# Patient Record
Sex: Male | Born: 1950 | Race: White | Hispanic: No | Marital: Married | Smoking: Former smoker
Health system: Southern US, Community
[De-identification: ages and names within clinical notes are randomized; demographics above are authoritative.]

## PROBLEM LIST (undated history)

## (undated) DIAGNOSIS — Z86718 Personal history of other venous thrombosis and embolism: Secondary | ICD-10-CM

## (undated) DIAGNOSIS — L089 Local infection of the skin and subcutaneous tissue, unspecified: Secondary | ICD-10-CM

## (undated) DIAGNOSIS — I4891 Unspecified atrial fibrillation: Secondary | ICD-10-CM

## (undated) DIAGNOSIS — E785 Hyperlipidemia, unspecified: Secondary | ICD-10-CM

## (undated) DIAGNOSIS — Z8673 Personal history of transient ischemic attack (TIA), and cerebral infarction without residual deficits: Secondary | ICD-10-CM

## (undated) DIAGNOSIS — E11628 Type 2 diabetes mellitus with other skin complications: Secondary | ICD-10-CM

## (undated) DIAGNOSIS — M869 Osteomyelitis, unspecified: Secondary | ICD-10-CM

## (undated) DIAGNOSIS — C679 Malignant neoplasm of bladder, unspecified: Secondary | ICD-10-CM

## (undated) DIAGNOSIS — I5022 Chronic systolic (congestive) heart failure: Secondary | ICD-10-CM

## (undated) HISTORY — PX: OTHER SURGICAL HISTORY: SHX169

---

## 2014-10-29 DIAGNOSIS — G8190 Hemiplegia, unspecified affecting unspecified side: Secondary | ICD-10-CM

## 2014-10-29 HISTORY — DX: Hemiplegia, unspecified affecting unspecified side: G81.90

## 2020-12-28 DIAGNOSIS — U071 COVID-19: Secondary | ICD-10-CM

## 2020-12-28 HISTORY — DX: COVID-19: U07.1

## 2021-01-02 ENCOUNTER — Emergency Department: Payer: Medicare HMO

## 2021-01-02 ENCOUNTER — Inpatient Hospital Stay
Admission: EM | Admit: 2021-01-02 | Discharge: 2021-01-07 | DRG: 872 | Disposition: A | Discharge status: Home / Self Care | Payer: Medicare HMO | Attending: Internal Medicine | Admitting: Internal Medicine

## 2021-01-02 ENCOUNTER — Other Ambulatory Visit: Payer: Self-pay

## 2021-01-02 ENCOUNTER — Encounter: Payer: Self-pay | Admitting: Emergency Medicine

## 2021-01-02 DIAGNOSIS — N134 Hydroureter: Secondary | ICD-10-CM | POA: Diagnosis present

## 2021-01-02 DIAGNOSIS — U071 COVID-19: Secondary | ICD-10-CM | POA: Diagnosis present

## 2021-01-02 DIAGNOSIS — I4891 Unspecified atrial fibrillation: Secondary | ICD-10-CM | POA: Diagnosis present

## 2021-01-02 DIAGNOSIS — E440 Moderate protein-calorie malnutrition: Secondary | ICD-10-CM | POA: Diagnosis present

## 2021-01-02 DIAGNOSIS — C679 Malignant neoplasm of bladder, unspecified: Secondary | ICD-10-CM | POA: Diagnosis present

## 2021-01-02 DIAGNOSIS — A419 Sepsis, unspecified organism: Secondary | ICD-10-CM | POA: Diagnosis present

## 2021-01-02 DIAGNOSIS — E11621 Type 2 diabetes mellitus with foot ulcer: Secondary | ICD-10-CM | POA: Diagnosis present

## 2021-01-02 DIAGNOSIS — Z599 Problem related to housing and economic circumstances, unspecified: Secondary | ICD-10-CM

## 2021-01-02 DIAGNOSIS — L89622 Pressure ulcer of left heel, stage 2: Secondary | ICD-10-CM | POA: Diagnosis present

## 2021-01-02 DIAGNOSIS — E11628 Type 2 diabetes mellitus with other skin complications: Secondary | ICD-10-CM | POA: Diagnosis present

## 2021-01-02 DIAGNOSIS — R197 Diarrhea, unspecified: Secondary | ICD-10-CM | POA: Diagnosis not present

## 2021-01-02 DIAGNOSIS — N390 Urinary tract infection, site not specified: Secondary | ICD-10-CM | POA: Diagnosis present

## 2021-01-02 DIAGNOSIS — Z794 Long term (current) use of insulin: Secondary | ICD-10-CM

## 2021-01-02 DIAGNOSIS — E785 Hyperlipidemia, unspecified: Secondary | ICD-10-CM | POA: Diagnosis present

## 2021-01-02 DIAGNOSIS — Z7901 Long term (current) use of anticoagulants: Secondary | ICD-10-CM

## 2021-01-02 DIAGNOSIS — L089 Local infection of the skin and subcutaneous tissue, unspecified: Secondary | ICD-10-CM | POA: Diagnosis present

## 2021-01-02 DIAGNOSIS — E1122 Type 2 diabetes mellitus with diabetic chronic kidney disease: Secondary | ICD-10-CM | POA: Diagnosis present

## 2021-01-02 DIAGNOSIS — E1151 Type 2 diabetes mellitus with diabetic peripheral angiopathy without gangrene: Secondary | ICD-10-CM | POA: Diagnosis present

## 2021-01-02 DIAGNOSIS — A0472 Enterocolitis due to Clostridium difficile, not specified as recurrent: Secondary | ICD-10-CM | POA: Diagnosis present

## 2021-01-02 DIAGNOSIS — Z87891 Personal history of nicotine dependence: Secondary | ICD-10-CM

## 2021-01-02 DIAGNOSIS — Z79899 Other long term (current) drug therapy: Secondary | ICD-10-CM

## 2021-01-02 DIAGNOSIS — I5022 Chronic systolic (congestive) heart failure: Secondary | ICD-10-CM | POA: Diagnosis present

## 2021-01-02 DIAGNOSIS — Z8616 Personal history of COVID-19: Secondary | ICD-10-CM

## 2021-01-02 DIAGNOSIS — R6520 Severe sepsis without septic shock: Secondary | ICD-10-CM | POA: Diagnosis present

## 2021-01-02 DIAGNOSIS — I130 Hypertensive heart and chronic kidney disease with heart failure and stage 1 through stage 4 chronic kidney disease, or unspecified chronic kidney disease: Secondary | ICD-10-CM | POA: Diagnosis present

## 2021-01-02 DIAGNOSIS — Z8673 Personal history of transient ischemic attack (TIA), and cerebral infarction without residual deficits: Secondary | ICD-10-CM | POA: Diagnosis not present

## 2021-01-02 DIAGNOSIS — A414 Sepsis due to anaerobes: Principal | ICD-10-CM | POA: Diagnosis present

## 2021-01-02 DIAGNOSIS — I4811 Longstanding persistent atrial fibrillation: Secondary | ICD-10-CM | POA: Diagnosis not present

## 2021-01-02 DIAGNOSIS — Z9862 Peripheral vascular angioplasty status: Secondary | ICD-10-CM

## 2021-01-02 DIAGNOSIS — N179 Acute kidney failure, unspecified: Secondary | ICD-10-CM | POA: Diagnosis present

## 2021-01-02 DIAGNOSIS — I4819 Other persistent atrial fibrillation: Secondary | ICD-10-CM | POA: Diagnosis present

## 2021-01-02 DIAGNOSIS — N184 Chronic kidney disease, stage 4 (severe): Secondary | ICD-10-CM | POA: Diagnosis present

## 2021-01-02 DIAGNOSIS — Z888 Allergy status to other drugs, medicaments and biological substances status: Secondary | ICD-10-CM

## 2021-01-02 DIAGNOSIS — Z86718 Personal history of other venous thrombosis and embolism: Secondary | ICD-10-CM

## 2021-01-02 DIAGNOSIS — E875 Hyperkalemia: Secondary | ICD-10-CM | POA: Diagnosis present

## 2021-01-02 DIAGNOSIS — L97529 Non-pressure chronic ulcer of other part of left foot with unspecified severity: Secondary | ICD-10-CM | POA: Diagnosis present

## 2021-01-02 HISTORY — DX: Type 2 diabetes mellitus with other skin complications: E11.628

## 2021-01-02 HISTORY — DX: Osteomyelitis, unspecified: M86.9

## 2021-01-02 HISTORY — DX: Personal history of transient ischemic attack (TIA), and cerebral infarction without residual deficits: Z86.73

## 2021-01-02 HISTORY — DX: Malignant neoplasm of bladder, unspecified: C67.9

## 2021-01-02 HISTORY — DX: Hyperlipidemia, unspecified: E78.5

## 2021-01-02 HISTORY — DX: Chronic systolic (congestive) heart failure: I50.22

## 2021-01-02 HISTORY — DX: Unspecified atrial fibrillation: I48.91

## 2021-01-02 HISTORY — DX: Personal history of other venous thrombosis and embolism: Z86.718

## 2021-01-02 HISTORY — DX: Local infection of the skin and subcutaneous tissue, unspecified: L08.9

## 2021-01-02 LAB — GASTROINTESTINAL PANEL BY PCR, STOOL (REPLACES STOOL CULTURE)

## 2021-01-02 LAB — CBC WITH DIFFERENTIAL/PLATELET
Abs Immature Granulocytes: 0.08 10*3/uL — ABNORMAL HIGH (ref 0.00–0.07)
Basophils Absolute: 0.1 10*3/uL (ref 0.0–0.1)
Basophils Relative: 0 %
Eosinophils Absolute: 0 10*3/uL (ref 0.0–0.5)
Eosinophils Relative: 0 %
HCT: 36.5 % — ABNORMAL LOW (ref 39.0–52.0)
Hemoglobin: 12.1 g/dL — ABNORMAL LOW (ref 13.0–17.0)
Immature Granulocytes: 1 %
Lymphocytes Relative: 2 %
Lymphs Abs: 0.4 10*3/uL — ABNORMAL LOW (ref 0.7–4.0)
MCH: 25.9 pg — ABNORMAL LOW (ref 26.0–34.0)
MCHC: 33.2 g/dL (ref 30.0–36.0)
MCV: 78.2 fL — ABNORMAL LOW (ref 80.0–100.0)
Monocytes Absolute: 0.5 10*3/uL (ref 0.1–1.0)
Monocytes Relative: 3 %
Neutro Abs: 15.4 10*3/uL — ABNORMAL HIGH (ref 1.7–7.7)
Neutrophils Relative %: 94 %
Platelets: 147 10*3/uL — ABNORMAL LOW (ref 150–400)
RBC: 4.67 MIL/uL (ref 4.22–5.81)
RDW: 15.4 % (ref 11.5–15.5)
WBC: 16.4 10*3/uL — ABNORMAL HIGH (ref 4.0–10.5)
nRBC: 0 % (ref 0.0–0.2)

## 2021-01-02 LAB — COMPREHENSIVE METABOLIC PANEL
ALT: 36 U/L (ref 0–44)
AST: 24 U/L (ref 15–41)
Albumin: 3.4 g/dL — ABNORMAL LOW (ref 3.5–5.0)
Alkaline Phosphatase: 78 U/L (ref 38–126)
Anion gap: 12 (ref 5–15)
BUN: 87 mg/dL — ABNORMAL HIGH (ref 8–23)
CO2: 22 mmol/L (ref 22–32)
Calcium: 9 mg/dL (ref 8.9–10.3)
Chloride: 100 mmol/L (ref 98–111)
Creatinine, Ser: 3.17 mg/dL — ABNORMAL HIGH (ref 0.61–1.24)
GFR, Estimated: 20 mL/min — ABNORMAL LOW (ref >60)
Glucose, Bld: 207 mg/dL — ABNORMAL HIGH (ref 70–99)
Potassium: 5.7 mmol/L — ABNORMAL HIGH (ref 3.5–5.1)
Sodium: 134 mmol/L — ABNORMAL LOW (ref 135–145)
Total Bilirubin: 0.8 mg/dL (ref 0.3–1.2)
Total Protein: 7.6 g/dL (ref 6.5–8.1)

## 2021-01-02 LAB — PROTIME-INR
INR: 1.3 — ABNORMAL HIGH (ref 0.8–1.2)
Prothrombin Time: 15.7 seconds — ABNORMAL HIGH (ref 11.4–15.2)

## 2021-01-02 LAB — URINALYSIS, COMPLETE (UACMP) WITH MICROSCOPIC
Bilirubin Urine: NEGATIVE
Glucose, UA: NEGATIVE mg/dL
Ketones, ur: NEGATIVE mg/dL
Leukocytes,Ua: NEGATIVE
Nitrite: NEGATIVE
Protein, ur: 100 mg/dL — AB
RBC / HPF: >50 RBC/hpf — ABNORMAL HIGH (ref 0–5)
Specific Gravity, Urine: 1.013 (ref 1.005–1.030)
pH: 5 (ref 5.0–8.0)

## 2021-01-02 LAB — C DIFFICILE QUICK SCREEN W PCR REFLEX
C Diff antigen: POSITIVE — AB
C Diff interpretation: DETECTED
C Diff toxin: POSITIVE — AB

## 2021-01-02 LAB — MAGNESIUM: Magnesium: 1.7 mg/dL (ref 1.7–2.4)

## 2021-01-02 LAB — CBG MONITORING, ED
Glucose-Capillary: 177 mg/dL — ABNORMAL HIGH (ref 70–99)
Glucose-Capillary: 250 mg/dL — ABNORMAL HIGH (ref 70–99)
Glucose-Capillary: 150 mg/dL — ABNORMAL HIGH (ref 70–99)

## 2021-01-02 LAB — LACTIC ACID, PLASMA: Lactic Acid, Venous: 1.7 mmol/L (ref 0.5–1.9)

## 2021-01-02 LAB — GLUCOSE, CAPILLARY: Glucose-Capillary: 140 mg/dL — ABNORMAL HIGH (ref 70–99)

## 2021-01-02 LAB — BRAIN NATRIURETIC PEPTIDE: B Natriuretic Peptide: 283.6 pg/mL — ABNORMAL HIGH (ref 0.0–100.0)

## 2021-01-02 MED ORDER — MAGNESIUM SULFATE IN D5W 1-5 GM/100ML-% IV SOLN
1.0000 g | Freq: Once | INTRAVENOUS | Status: AC
  Administered 2021-01-02: 1 g via INTRAVENOUS
  Filled 2021-01-02: qty 100

## 2021-01-02 MED ORDER — INSULIN ASPART 100 UNIT/ML IJ SOLN
0.0000 [IU] | Freq: Every day | INTRAMUSCULAR | Status: DC
  Administered 2021-01-04: 3 [IU] via SUBCUTANEOUS
  Administered 2021-01-06: 4 [IU] via SUBCUTANEOUS
  Filled 2021-01-02 (×2): qty 1

## 2021-01-02 MED ORDER — ALBUTEROL SULFATE (2.5 MG/3ML) 0.083% IN NEBU: 3.0000 mL | INHALATION_SOLUTION | RESPIRATORY_TRACT | Status: DC | PRN

## 2021-01-02 MED ORDER — LACTATED RINGERS IV BOLUS
1000.0000 mL | Freq: Once | INTRAVENOUS | Status: AC
  Administered 2021-01-02: 1000 mL via INTRAVENOUS

## 2021-01-02 MED ORDER — FAMOTIDINE IN NACL 20-0.9 MG/50ML-% IV SOLN
20.0000 mg | Freq: Every day | INTRAVENOUS | Status: DC
  Administered 2021-01-02 – 2021-01-05 (×4): 20 mg via INTRAVENOUS
  Filled 2021-01-02 (×5): qty 50

## 2021-01-02 MED ORDER — ACETAMINOPHEN 325 MG PO TABS: 650.0000 mg | ORAL_TABLET | Freq: Four times a day (QID) | ORAL | Status: DC | PRN

## 2021-01-02 MED ORDER — ONDANSETRON HCL 4 MG/2ML IJ SOLN: 4.0000 mg | Freq: Three times a day (TID) | INTRAMUSCULAR | Status: DC | PRN

## 2021-01-02 MED ORDER — INSULIN ASPART 100 UNIT/ML IV SOLN
5.0000 [IU] | Freq: Once | INTRAVENOUS | Status: DC
  Filled 2021-01-02: qty 0.05

## 2021-01-02 MED ORDER — APIXABAN 5 MG PO TABS
5.0000 mg | ORAL_TABLET | Freq: Two times a day (BID) | ORAL | Status: DC
  Administered 2021-01-02 – 2021-01-07 (×11): 5 mg via ORAL
  Filled 2021-01-02 (×11): qty 1

## 2021-01-02 MED ORDER — DEXTROSE 50 % IV SOLN
50.0000 mL | INTRAVENOUS | Status: DC | PRN
  Administered 2021-01-02: 50 mL via INTRAVENOUS
  Filled 2021-01-02: qty 50

## 2021-01-02 MED ORDER — FIDAXOMICIN 200 MG PO TABS
200.0000 mg | ORAL_TABLET | Freq: Two times a day (BID) | ORAL | Status: DC
  Administered 2021-01-02 – 2021-01-07 (×11): 200 mg via ORAL
  Filled 2021-01-02 (×15): qty 1

## 2021-01-02 MED ORDER — DM-GUAIFENESIN ER 30-600 MG PO TB12: 1.0000 | ORAL_TABLET | Freq: Two times a day (BID) | ORAL | Status: DC | PRN

## 2021-01-02 MED ORDER — INSULIN ASPART 100 UNIT/ML IV SOLN
3.0000 [IU] | Freq: Once | INTRAVENOUS | Status: AC
  Administered 2021-01-02: 3 [IU] via INTRAVENOUS
  Filled 2021-01-02: qty 0.03

## 2021-01-02 MED ORDER — INSULIN ASPART 100 UNIT/ML IJ SOLN
0.0000 [IU] | Freq: Three times a day (TID) | INTRAMUSCULAR | Status: DC
  Administered 2021-01-02: 2 [IU] via SUBCUTANEOUS
  Administered 2021-01-02: 3 [IU] via SUBCUTANEOUS
  Administered 2021-01-03 – 2021-01-04 (×3): 2 [IU] via SUBCUTANEOUS
  Administered 2021-01-05: 3 [IU] via SUBCUTANEOUS
  Administered 2021-01-05 – 2021-01-06 (×3): 2 [IU] via SUBCUTANEOUS
  Administered 2021-01-06: 5 [IU] via SUBCUTANEOUS
  Administered 2021-01-06 – 2021-01-07 (×2): 3 [IU] via SUBCUTANEOUS
  Filled 2021-01-02 (×11): qty 1

## 2021-01-02 MED ORDER — ONDANSETRON HCL 4 MG/2ML IJ SOLN
INTRAMUSCULAR | Status: AC
  Administered 2021-01-02: 4 mg via INTRAVENOUS
  Filled 2021-01-02: qty 2

## 2021-01-02 MED ORDER — ONDANSETRON HCL 4 MG/2ML IJ SOLN: 4.0000 mg | INTRAMUSCULAR | Status: AC

## 2021-01-02 MED ORDER — SODIUM ZIRCONIUM CYCLOSILICATE 10 G PO PACK
10.0000 g | PACK | ORAL | Status: AC
  Administered 2021-01-02: 10 g via ORAL
  Filled 2021-01-02: qty 1

## 2021-01-02 MED ORDER — METOPROLOL SUCCINATE ER 100 MG PO TB24
200.0000 mg | ORAL_TABLET | Freq: Every day | ORAL | Status: DC
  Administered 2021-01-02 – 2021-01-07 (×6): 200 mg via ORAL
  Filled 2021-01-02 (×5): qty 2
  Filled 2021-01-02: qty 4
  Filled 2021-01-02: qty 2

## 2021-01-02 MED ORDER — LACTATED RINGERS IV SOLN: INTRAVENOUS | Status: AC

## 2021-01-02 MED ORDER — INSULIN GLARGINE-YFGN 100 UNIT/ML ~~LOC~~ SOLN
5.0000 [IU] | Freq: Every day | SUBCUTANEOUS | Status: DC
  Administered 2021-01-02 – 2021-01-04 (×3): 5 [IU] via SUBCUTANEOUS
  Filled 2021-01-02 (×4): qty 0.05

## 2021-01-02 NOTE — ED Notes (Signed)
When asked about being diabetic. Pt advised he was pulled off his metformin awhile back and currently takes no medication for diabetes.

## 2021-01-02 NOTE — ED Provider Notes (Signed)
[facility] Emergency Department Provider Note  ____________________________________________   Event Date/Time   First MD Initiated Contact with Patient 01/02/21 [phone]     (approximate)  I have reviewed the triage vital signs and the nursing notes.   HISTORY  Chief Complaint No chief complaint on file.    HPI Christopher Davis is a 70 y.o. male who receives all of his medical care at [facility] and has recently been hospitalized there for acute kidney injury and hyperkalemia and uremia in the setting of COVID-19 diagnosis on 12/28/2020 (about 5 days ago).  He has been discharged from the hospital for less than 2 full days.  He presents tonight for generalized weakness.  He said that he has had diarrhea for weeks and has been on strong antibiotics (reportedly cefepime and vancomycin) for diabetic foot infection.  He is relatively recent status post amputation of toes on his left foot.  He is healing well from that but he had to be hospitalized within the [facility] system recently for his kidney problems and his potassium issues.  He was feeling a little bit better when he went home but then he gradually got worse and tonight felt much worse and was unable to ambulate due to weakness.  Paramedics report that he was "covered" in diarrhea.  The patient states that diarrhea has been an ongoing problem and that it is copious.  However he is not having abdominal pain.  He is having no nausea nor vomiting and he is trying to eat and drink.  He has been lightheaded.  He denies chest pain or shortness of breath in spite of his recent COVID-19 diagnosis.  Overall his symptoms are severe, exertion makes it worse, nothing in particular makes it better.     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 difficile colitis 01/02/2021     History reviewed. No pertinent surgical history.  Prior to Admission medications   Medication Sig Start Date End Date Taking? Authorizing Provider  acetaminophen (TYLENOL) 650 MG CR tablet Take 1,300 mg by mouth every 8 (eight) hours as needed.   Yes [provider]  apixaban (ELIQUIS) 5 MG TABS tablet Take 5 mg by mouth 2 (two) times daily. 10/21/20  Yes [provider]  insulin glargine (LANTUS) 100 UNIT/ML Solostar Pen Inject 10 Units into the skin at bedtime. 12/31/20  Yes [provider]  metoprolol succinate (TOPROL-XL) 100 MG 24 hr tablet Take 2 tablets by mouth daily. 09/15/20 09/15/21 Yes [provider]    Allergies Rosuvastatin  History reviewed. No pertinent family history.  Social History Social History   Tobacco Use   Smoking status: Former    Types: Cigarettes   Smokeless tobacco: Never    Review of Systems Constitutional: No fever/chills Eyes: No visual changes. ENT: No sore throat. Cardiovascular: Denies chest pain. Respiratory: Denies shortness of breath. Gastrointestinal: Positive for copious diarrhea for weeks.  Negative for abdominal pain and vomiting. Genitourinary: Negative for dysuria. Musculoskeletal: Improving left  foot after recent toe amputation. Integumentary: Negative for rash. Neurological: Positive for generalized weakness.  Negative for headaches, focal weakness or numbness.   ____________________________________________   PHYSICAL EXAM:  VITAL SIGNS: ED Triage Vitals [01/02/21 0400]  Enc Vitals Group     BP (!) 143/87     Pulse Rate (!) 120     Resp (!) 22     Temp 99.9 F (37.7 C)     Temp Source Oral     SpO2 96 %      Weight 69.9 kg (154 lb)     Height 1.753 m (5\' 9" )     Head Circumference      Peak Flow      Pain Score      Pain Loc      Pain Edu?      Excl. in [place]?     Constitutional: Alert and oriented.  Eyes: Conjunctivae are normal.  Head: Atraumatic. Nose: No congestion/rhinnorhea. Mouth/Throat: Patient is wearing a mask. Neck: No stridor.  No meningeal signs.   Cardiovascular: Tachycardia with irregularly irregular rhythm. Good peripheral circulation. Respiratory: Normal respiratory effort.  No retractions. Gastrointestinal: Soft and nontender. No distention.  Musculoskeletal: Status post multiple toe amputations on the left foot with a well-appearing wound, no evidence of acute infection. Neurologic:  Normal speech and language. No gross focal neurologic deficits are appreciated.  Skin:  Skin is warm, dry and intact. Psychiatric: Mood and affect are normal. Speech and behavior are normal.  ____________________________________________   LABS (all labs ordered are listed, but only abnormal results are displayed)  Labs Reviewed  C DIFFICILE QUICK SCREEN W PCR REFLEX   - Abnormal; Notable for the following components:      Result Value   C Diff antigen POSITIVE (*)    C Diff toxin POSITIVE (*)    All other components within normal limits  COMPREHENSIVE METABOLIC PANEL - Abnormal; Notable for the following components:   Sodium 134 (*)    Potassium 5.7 (*)    Glucose, Bld 207 (*)    BUN 87 (*)    Creatinine, Ser 3.17 (*)    Albumin 3.4 (*)    GFR, Estimated 20 (*)    All other components within normal limits  CBC WITH DIFFERENTIAL/PLATELET - Abnormal; Notable for the following components:   WBC 16.4 (*)    Hemoglobin 12.1 (*)    HCT 36.5 (*)    MCV 78.2 (*)    MCH 25.9 (*)    Platelets 147 (*)    Neutro Abs 15.4 (*)    Lymphs Abs 0.4 (*)    Abs Immature Granulocytes 0.08 (*)    All other components within normal limits  PROTIME-INR - Abnormal; Notable for the following  components:   Prothrombin Time 15.7 (*)    INR 1.3 (*)    All other components within normal limits  URINALYSIS, COMPLETE (UACMP) WITH MICROSCOPIC - Abnormal; Notable for the following components:   Color, Urine YELLOW (*)    APPearance CLOUDY (*)    Hgb urine dipstick LARGE (*)    Protein, ur 100 (*)    RBC / HPF >50 (*)    Bacteria, UA RARE (*)    All other components within normal limits  CULTURE, BLOOD (ROUTINE X 2)  CULTURE, BLOOD (ROUTINE X 2)  GASTROINTESTINAL PANEL BY PCR, STOOL (REPLACES STOOL CULTURE)  URINE CULTURE  LACTIC ACID, PLASMA  MAGNESIUM  BRAIN NATRIURETIC PEPTIDE   ____________________________________________  EKG  ED  ECG REPORT I, Hinda Kehr, the attending physician, personally viewed and interpreted this ECG.  Date: 01/02/2021 EKG Time: 4:49 AM Rate: 141 Rhythm: Atrial fibrillation with RVR QRS Axis: normal Intervals: normal ST/T Wave abnormalities: Non-specific ST segment / T-wave changes, but no clear evidence of acute ischemia. Narrative Interpretation: no definitive evidence of acute ischemia; does not meet STEMI criteria.  ____________________________________________  RADIOLOGY I, Hinda Kehr, personally viewed and evaluated these images (plain radiographs) as part of my medical decision making, as well as reviewing the written report by the radiologist.  ED MD interpretation: No acute abnormalities on chest x-ray.  Status post metatarsal amputation of fourth and fifth digits on the left foot.  Soft tissue ulceration, no evidence of persistent osteomyelitis.  Official radiology report(s): CT ABDOMEN PELVIS WO CONTRAST  Result Date: 01/02/2021 CLINICAL DATA:  Renal failure.  Diarrhea.  Obstructive uropathy. EXAM: CT ABDOMEN AND PELVIS WITHOUT CONTRAST TECHNIQUE: Multidetector CT imaging of the abdomen and pelvis was performed following the standard protocol without IV contrast. COMPARISON:  None. FINDINGS: Lower chest: No acute abnormality.  Hepatobiliary: No focal liver abnormality is seen. No gallstones, gallbladder wall thickening, or biliary dilatation. Pancreas: Unremarkable. No pancreatic ductal dilatation or surrounding inflammatory changes. Spleen: Normal in size without focal abnormality. Adrenals/Urinary Tract: Normal adrenal glands. No kidney mass or stones identified bilaterally. Right pelviectasis and mild right hydroureter to the level of the urinary bladder identified. Posterior dome of bladder wall lesion measures 1.8 cm, image 69/6. A second lesion is noted along the posterior base measuring 5.9 cm,, image 58/6. This second lesion appears to obstruct the right UVJ, image 76/2. Both of these are suspicious for urothelial neoplasms. Stomach/Bowel: Stomach appears normal. The appendix is not confidently identified. Within the limitations of unenhanced technique there is diffuse colonic wall thickening with mild pericolonic inflammatory soft tissue stranding concerning for colitis. No signs of pneumatosis or bowel perforation. No evidence for bowel obstruction. Moderate stool burden identified within the sigmoid colon and rectum. Vascular/Lymphatic: Aortic atherosclerosis. No aneurysm. No abdominopelvic adenopathy. Reproductive: Prostate is unremarkable. Other: No abdominal wall hernia or abnormality. No abdominopelvic ascites. Musculoskeletal: No acute or significant osseous findings. Marked degenerative disc disease identified within the lumbar spine. This is most advanced at L4-5 and L5-S1. IMPRESSION: 1. Examination is positive for diffuse colonic wall thickening with mild pericolonic inflammatory soft tissue stranding concerning for colitis. No signs of pneumatosis or bowel perforation. 2. Right-sided pelviectasis and mild right hydroureter to the level of the urinary bladder. 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 3. Lumbar spondylosis. 4. Aortic atherosclerosis. Aortic Atherosclerosis  (ICD10-I70.0). Electronically Signed   By: Kerby Moors M.D.   On: 01/02/2021 06:37   DG Chest Portable 1 View  Result Date: 01/02/2021 CLINICAL DATA:  70 year old male with diarrhea, fever, left foot wound. Diaphoresis. EXAM: PORTABLE CHEST 1 VIEW COMPARISON:  None. FINDINGS: Portable AP upright view at 0430 hours. Lung volumes and mediastinal contours are within normal limits. Visualized tracheal air column is within normal limits. Calcified aortic atherosclerosis. Allowing for portable technique the lungs are clear. Somewhat symmetric skin fold artifacts suspected over both upper lobes. No pneumothorax or pleural effusion. No acute osseous abnormality identified. IMPRESSION: 1.  No acute cardiopulmonary abnormality. 2.  Aortic Atherosclerosis (ICD10-I70.0). Electronically Signed   By: Genevie Ann M.D.   On: 01/02/2021 04:57   DG Foot Complete Left  Result Date: 01/02/2021 CLINICAL DATA:  Sepsis, chronic left foot wound EXAM: LEFT FOOT - COMPLETE  3+ VIEW COMPARISON:  None. FINDINGS: Transmetatarsal amputation of the a fourth and fifth digits has been performed. A a probable deep ulcer is seen within the soft tissues of the residual left forefoot laterally anterior to the a residual fourth metatarsal. There is no subjacent osseous erosion to suggest osteomyelitis. No acute fracture or dislocation. Residual joint spaces are preserved. Advanced vascular calcifications are seen within the left foot. Large plantar calcaneal spur. 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 Anterolateral soft tissue ulceration. No superimposed osseous erosion. Electronically Signed   By: Fidela Salisbury MD   On: 01/02/2021 04:59    ____________________________________________   PROCEDURES   Procedure(s) performed (including Critical Care):  .1-3 Lead EKG Interpretation  Date/Time: 01/02/2021 8:02 AM Performed by: Hinda Kehr, MD Authorized by: Hinda Kehr, MD     Interpretation:  abnormal     ECG rate:  115   ECG rate assessment: tachycardic     Rhythm: atrial fibrillation     Ectopy: none     Conduction: normal   .Critical Care  Date/Time: 01/02/2021 8:02 AM Performed by: Hinda Kehr, MD Authorized by: Hinda Kehr, MD   Critical care provider statement:    Critical care time (minutes):  60   Critical care time was exclusive of:  Separately billable procedures and treating other patients   Critical care was necessary to treat or prevent imminent or life-threatening deterioration of the following conditions:  Sepsis   Critical care was time spent personally by me on the following activities:  Development of treatment plan with patient or surrogate, discussions with consultants, evaluation of patient's response to treatment, examination of patient, obtaining history from patient or surrogate, ordering and performing treatments and interventions, ordering and review of laboratory studies, ordering and review of radiographic studies, pulse oximetry, re-evaluation of patient's condition and review of old charts   ____________________________________________   [place] / MDM / [place] / ED COURSE  As part of my medical decision making, I reviewed the following data within the [place] notes reviewed and incorporated, Labs reviewed , EKG interpreted , Old chart reviewed, Discussed with admitting physician , and Notes from prior ED visits   Differential diagnosis includes, but is not limited to, C. difficile colitis, sepsis, UTI, pneumonia, bacteremia, wound infection.  Patient's history of weeks of copious and worsening diarrhea in the setting of extended course of broad-spectrum antibiotics for osteomyelitis strongly suggest C. difficile.  Additionally, the character and quality of the diarrhea also strongly suggest C. difficile.  GI pathogen panel and C. difficile studies are pending.  Vital signs are notable for A.  fib with RVR and some tachypnea although I suspect this is due to his C. difficile sepsis.  He has having no vomiting nor abdominal pain and spite of his probable infection.  No indication for antiemetics nor pain medicine but I am ordering 2 L of lactated Ringer's were what appears to be substantial volume depletion.  The patient is on the cardiac monitor to evaluate for evidence of arrhythmia and/or significant heart rate changes.  His lab work came back fairly quickly and was notable for a creatinine of greater than 3.  I reviewed his medical record and see that when he was admitted within the last week to [facility], he had a creatinine of greater than 2 but not quite this high.  Apparently he has again worsened after going home.  Additionally he has hyperkalemia but with no significant EKG  changes.  I am ordering Lokelma 10 g but I suspect that fluid resuscitation will also help with the hyperkalemia as it will also help with kidney function.  His CBC is notable for a leukocytosis of 16.4 which he did NOT have when he was at [facility].  No indication to repeat his COVID test because he tested +5 days ago.  Urinalysis shows hematuria but he also has known ureteral or bladder masses suggestive of cancer and there is no indication of urinary tract infection.  His lactic acid is normal at 1.7.  He remains ill-appearing and tachycardic.     Clinical Course as of 01/02/21 0806  Sat Jan 02, 2021  0533 C Difficile Quick Screen w PCR reflex(!) Antigen and toxin are positive.  I will initiate treatment once the CT results are back proving that he does not have toxic megacolon.  Anticipate treatment with fidaxomicin [CF]  0648 CT ABDOMEN PELVIS WO CONTRAST Colitis, probable urinary neoplasm, no evidence of megacolon.  Putting an order for the DEXA [place] and consulting the hospitalist for admission. [CF]  [phone] Although the patient meets criteria for sepsis, I strongly believe that his issue is C. difficile infection in  the setting of acute on chronic kidney disease.  Giving him broad-spectrum empiric antibiotics would be possibly the worst thing for him.  I am ordering fortunately no 200 mg [CF]  0706 I updated the patient about his results and he understands the need to stay in the hospital. [CF]  0708 Patient now actively vomiting.  Ordered Zofran 4 mg IV. [CF]  0801 I discussed the case by phone with Dr. Blaine Hamper with the hospitalist service and he will admit. [CF]    Clinical Course User Index [CF] Hinda Kehr, MD     ____________________________________________  FINAL CLINICAL IMPRESSION(S) / ED DIAGNOSES  Final diagnoses:  C. difficile colitis  Sepsis with acute renal failure without septic shock, due to unspecified organism, unspecified acute renal failure type ([place])  Hyperkalemia  Acute renal failure, unspecified acute renal failure type ([place])     MEDICATIONS GIVEN DURING THIS VISIT:  Medications  fidaxomicin (DIFICID) tablet 200 mg (has no administration in time range)  lactated ringers infusion ( Intravenous New Bag/Given 01/02/21 0755)  ondansetron (ZOFRAN) injection 4 mg (has no administration in time range)  insulin aspart (novoLOG) injection 0-9 Units (has no administration in time range)  insulin aspart (novoLOG) injection 0-5 Units (has no administration in time range)  lactated ringers bolus 1,000 mL (0 mLs Intravenous Stopped 01/02/21 0620)  lactated ringers bolus 1,000 mL (0 mLs Intravenous Stopped 01/02/21 0725)  sodium zirconium cyclosilicate (LOKELMA) packet 10 g (10 g Oral Given 01/02/21 0723)  ondansetron (ZOFRAN) injection 4 mg (4 mg Intravenous Given 01/02/21 6203)     ED Discharge Orders     None        Note:  This document was prepared using Dragon voice recognition software and may include unintentional dictation errors.   Hinda Kehr, MD 01/02/21 [phone]

## 2021-01-02 NOTE — Sepsis Progress Note (Signed)
[facility] is following this code sepsis ?

## 2021-01-02 NOTE — Consult Note (Signed)
WOC Nurse Consult Note: Reason for Consult: Surgical site care recommendations on left lateral foot and Stage 2 pressure injury to left heel (POA). Patient is followed closely by the [facility] (Dr. Serita Grit). It is recommended that the patient return to their care and oversight post discharge. Topical care products used in the Clinic are not on formulary here I.e., collagen, Anacept; I will implement a POC with similar goals (antimicrobial, nonadherent) while in house. Wound type: Surgical, pressure Pressure Injury POA: Yes Measurement:TO be obtained by Bedside RN prior to dressing change and documented on Nursing Flow Sheet Wound bed:red, moist Drainage (amount, consistency, odor) small serous to serosanguinous  Periwound: intact, dry, peeling Dressing procedure/placement/frequency: I will provide Nursing with guidance for the application of an antimicrobial nonadherent with twice daily changes.  Pressure redistribution heel boots are provided.  A prophylactic sacral dressing is to be applied.   [facility] nursing team will not follow, but will remain available to this patient, the nursing and medical teams.  Please re-consult if needed. Thanks, Maudie Flakes, MSN, RN, [place], [facility]  Pager# [phone]

## 2021-01-02 NOTE — ED Notes (Signed)
Give the D50 with the IV insulin per MD Blaine Hamper for the K+

## 2021-01-02 NOTE — ED Triage Notes (Signed)
Pt states he is taking two antibiotics for a wound on his left foot. Pt arrives stating that his legs gave out from under him last night. Pt is covered in diarrhea. Palpable pulse 120. Ems states pt was diaphoretic on their arrival.

## 2021-01-02 NOTE — H&P (Signed)
History and Physical    Christopher Davis CWC:[id number] DOB: January 10, 1951 DOA: 01/02/2021  Referring MD/NP/PA:   PCP: Jill Alexanders, MD   Patient coming from:  The patient is coming from home.  At baseline, pt is independent for most of ADL.        Chief Complaint: Diarrhea  HPI: Christopher Davis is a 70 y.o. male with medical history significant of bladder cancer, hyperlipidemia, diabetes mellitus, stroke with mild left-sided weakness, DVT and A. fib on Eliquis, sCHF with EF 45-50%, osteomyelitis of left foot (s/p of amputation of 4th and 5th toe), CKD-IV, COVID-19 infection 12/28/2020, who presents with diarrhea.  Patient states that he has history of osteomyelitis of the left foot.  He was treated with strong antibiotics (reportedly cefepime and vancomycin) and underwent amputation of fourth and fifth toe of left foot recently.  The wound has been healing well.  He states that he developed diarrhea which has been going on for 4 weeks. He has several times of watery diarrhea every day.  He has intermittent mild nausea and vomiting, but no abdominal pain.  Patient states that he was recently hospitalized to [facility] for worsening renal function and hyperkalemia in the setting of COVID-19 infection. Had had positive covid19 test on 12/28/2020, but he is basically asymptomatic from COVID infection.  He denies chest pain, cough, shortness breath.  No fever or chills.  He has generalized weakness.  Denies symptoms of UTI.  ED Course: pt was found to have C. difficile test positive for antigen and toxin, negative GI pathogen panel.  WBC 16.4, lactic acid 1.7, INR 1.3, urinalysis (cloudy appearance, negative leukocyte, rare bacteria, WBC 6-10), potassium 5.7,  renal function close to baseline, temperature 99.9, heart rate 120, blood pressure 121/77, 100/62, RR 24, oxygen saturation 96% on room air.  Chest x-ray negative for infiltration.  X-ray of left foot is negative for new acute issues.  Patient is admitted  to progressive bed as inpatient.  CT of abdomen/pelvis: 1. Examination is positive for diffuse colonic wall thickening with mild pericolonic inflammatory soft tissue stranding concerning for colitis. No signs of pneumatosis or bowel perforation.  2. Right-sided pelviectasis and mild right hydroureter to the level of the urinary bladder. 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 3. Lumbar spondylosis. 4. Aortic atherosclerosis.   Aortic Atherosclerosis (ICD10-I70.0).  Review of Systems:   General: no fevers, chills, no body weight gain, has poor appetite, has fatigue HEENT: no blurry vision, hearing changes or sore throat Respiratory: no dyspnea, coughing, wheezing CV: no chest pain, no palpitations GI: has nausea, vomiting, diarrhea, no constipation or  abdominal pain, GU: no dysuria, burning on urination, increased urinary frequency, hematuria  Ext: no leg edema Neuro: no unilateral weakness, numbness, or tingling, no vision change or hearing loss Skin: no rash. Has a small wound in left foot from amputation of fourth and fifth toe MSK: No muscle spasm, no deformity, no limitation of range of movement in spin Heme: No easy bruising.  Travel history: No recent long distant travel.  Allergy:  Allergies  Allergen Reactions   Rosuvastatin     Caused "body aches" per pt    Past Medical History:  Diagnosis Date   Atrial fibrillation (HCC)    Bladder cancer ([place])    Chronic HFrEF (heart failure with reduced ejection fraction) ([place])    COVID-19 12/28/2020   Diabetic infection of left foot ([place])    Hemiparesis ([place]) 10/29/2014   History of  CVA (cerebrovascular accident)    Hyperlipidemia    Osteomyelitis of great toe (HCC)    Personal history of DVT (deep vein thrombosis)     Past Surgical History:  Procedure Laterality Date   Amputation of left fourth and fifth toes Left     Social History:  reports that he has quit  smoking. His smoking use included cigarettes. He has never used smokeless tobacco. No history on file for alcohol use and drug use.  Family History:  Family History  Problem Relation Age of Onset   Cancer Mother    Melanoma Father    Kidney cancer Brother      Prior to Admission medications   Medication Sig Start Date End Date Taking? Authorizing Provider  acetaminophen (TYLENOL) 650 MG CR tablet Take 1,300 mg by mouth every 8 (eight) hours as needed.   Yes [provider]  apixaban (ELIQUIS) 5 MG TABS tablet Take 5 mg by mouth 2 (two) times daily. 10/21/20  Yes [provider]  insulin glargine (LANTUS) 100 UNIT/ML Solostar Pen Inject 10 Units into the skin at bedtime. 12/31/20  Yes [provider]  metoprolol succinate (TOPROL-XL) 100 MG 24 hr tablet Take 2 tablets by mouth daily. 09/15/20 09/15/21 Yes [provider]    Physical Exam: Vitals:   01/02/21 1500 01/02/21 1530 01/02/21 1600 01/02/21 1630  BP: 113/72 110/66 (!) 108/52 110/65  Pulse: (!) 104 (!) 108 95 (!) 108  Resp: (!) 30 (!) 24 (!) 22   Temp:      TempSrc:      SpO2: 100% 100% 99% 100%  Weight:      Height:       General: Not in acute distress HEENT:       Eyes: PERRL, EOMI, no scleral icterus.       ENT: No discharge from the ears and nose, no pharynx injection, no tonsillar enlargement.        Neck: No JVD, no bruit, no mass felt. Heme: No neck lymph node enlargement. Cardiac: S1/S2, RRR, No murmurs, No gallops or rubs. Respiratory: No rales, wheezing, rhonchi or rubs. GI: Soft, nondistended, nontender, no rebound pain, no organomegaly, BS present. GU: No hematuria Ext: No pitting leg edema bilaterally. 1+DP/PT pulse bilaterally. Musculoskeletal: No joint deformities, No joint redness or warmth, no limitation of ROM in spin. Skin: No rashes. Has a small wound in left foot from amputation of fourth and fifth toe.     Neuro: Alert, oriented X3, cranial nerves II-XII  grossly intact, moves all extremities normally.  Psych: Patient is not psychotic, no suicidal or hemocidal ideation.  Labs on Admission: I have personally reviewed following labs and imaging studies  CBC: Recent Labs  Lab 01/02/21 0409  WBC 16.4*  NEUTROABS 15.4*  HGB 12.1*  HCT 36.5*  MCV 78.2*  PLT 160*   Basic Metabolic Panel: Recent Labs  Lab 01/02/21 0409  NA 134*  K 5.7*  CL 100  CO2 22  GLUCOSE 207*  BUN 87*  CREATININE 3.17*  CALCIUM 9.0  MG 1.7   GFR: Estimated Creatinine Clearance: 21.4 mL/min (A) (by C-G formula based on SCr of 3.17 mg/dL (H)). Liver Function Tests: Recent Labs  Lab 01/02/21 0409  AST 24  ALT 36  ALKPHOS 78  BILITOT 0.8  PROT 7.6  ALBUMIN 3.4*   No results for input(s): LIPASE, AMYLASE in the last 168 hours. No results for input(s): AMMONIA in the last 168 hours. Coagulation Profile: Recent Labs  Lab 01/02/21 0409  INR 1.3*   Cardiac Enzymes: No results for input(s): CKTOTAL, CKMB, CKMBINDEX, TROPONINI in the last 168 hours. BNP (last 3 results) No results for input(s): PROBNP in the last 8760 hours. HbA1C: No results for input(s): HGBA1C in the last 72 hours. CBG: Recent Labs  Lab 01/02/21 0814 01/02/21 1145 01/02/21 1706  GLUCAP 177* 250* 150*   Lipid Profile: No results for input(s): CHOL, HDL, LDLCALC, TRIG, CHOLHDL, LDLDIRECT in the last 72 hours. Thyroid Function Tests: No results for input(s): TSH, T4TOTAL, FREET4, T3FREE, THYROIDAB in the last 72 hours. Anemia Panel: No results for input(s): VITAMINB12, FOLATE, FERRITIN, TIBC, IRON, RETICCTPCT in the last 72 hours. Urine analysis:    Component Value Date/Time   COLORURINE YELLOW (A) 01/02/2021 0622   APPEARANCEUR CLOUDY (A) 01/02/2021 0622   LABSPEC 1.013 01/02/2021 0622   PHURINE 5.0 01/02/2021 0622   GLUCOSEU NEGATIVE 01/02/2021 0622   HGBUR LARGE (A) 01/02/2021 0622   BILIRUBINUR NEGATIVE 01/02/2021 0622   KETONESUR NEGATIVE 01/02/2021 0622    PROTEINUR 100 (A) 01/02/2021 0622   NITRITE NEGATIVE 01/02/2021 0622   LEUKOCYTESUR NEGATIVE 01/02/2021 0622   Sepsis Labs: @LABRCNTIP (procalcitonin:4,lacticidven:4) ) Recent Results (from the past 240 hour(s))  Culture, blood (Routine x 2)     Status: None (Preliminary result)   Collection Time: 01/02/21  4:09 AM   Specimen: BLOOD  Result Value Ref Range Status   Specimen Description BLOOD LEFT ANTECUBITAL  Final   Special Requests   Final    BOTTLES DRAWN AEROBIC AND ANAEROBIC Blood Culture adequate volume   Culture   Final    NO GROWTH <12 HOURS Performed at [facility], [address], [place], [place] [address]    Report Status PENDING  Incomplete  Culture, blood (Routine x 2)     Status: None (Preliminary result)   Collection Time: 01/02/21  4:10 AM   Specimen: BLOOD  Result Value Ref Range Status   Specimen Description BLOOD RIGHT ANTECUBITAL  Final   Special Requests   Final    BOTTLES DRAWN AEROBIC AND ANAEROBIC Blood Culture adequate volume   Culture   Final    NO GROWTH <12 HOURS Performed at [facility], [address] [place], [place] [address]    Report Status PENDING  Incomplete  C Difficile Quick Screen w PCR reflex     Status: Abnormal   Collection Time: 01/02/21  4:51 AM   Specimen: STOOL  Result Value Ref Range Status   C Diff antigen POSITIVE (A) NEGATIVE Final   C Diff toxin POSITIVE (A) NEGATIVE Final   C Diff interpretation Toxin producing C. difficile detected.  Final    Comment: CRITICAL RESULT CALLED TO, READ BACK BY AND VERIFIED WITH: [facility] SCHIFSELBEIN @ 0540 01/02/21 LFD Performed at [facility], [address] [place], [place] [address]   Gastrointestinal Panel by PCR , Stool     Status: None   Collection Time: 01/02/21  4:51 AM   Specimen: STOOL  Result Value Ref Range Status   Campylobacter species NOT DETECTED NOT DETECTED Final   Plesimonas shigelloides NOT DETECTED NOT DETECTED Final   Salmonella  species NOT DETECTED NOT DETECTED Final   Yersinia enterocolitica NOT DETECTED NOT DETECTED Final   Vibrio species NOT DETECTED NOT DETECTED Final   Vibrio cholerae NOT DETECTED NOT DETECTED Final   Enteroaggregative E coli (EAEC) NOT DETECTED NOT DETECTED Final   Enteropathogenic E coli (EPEC) NOT DETECTED NOT DETECTED Final  Enterotoxigenic E coli (ETEC) NOT DETECTED NOT DETECTED Final   Shiga like toxin producing E coli (STEC) NOT DETECTED NOT DETECTED Final   Shigella/Enteroinvasive E coli (EIEC) NOT DETECTED NOT DETECTED Final   Cryptosporidium NOT DETECTED NOT DETECTED Final   Cyclospora cayetanensis NOT DETECTED NOT DETECTED Final   Entamoeba histolytica NOT DETECTED NOT DETECTED Final   Giardia lamblia NOT DETECTED NOT DETECTED Final   Adenovirus F40/41 NOT DETECTED NOT DETECTED Final   Astrovirus NOT DETECTED NOT DETECTED Final   Norovirus GI/GII NOT DETECTED NOT DETECTED Final   Rotavirus A NOT DETECTED NOT DETECTED Final   Sapovirus (I, II, IV, and V) NOT DETECTED NOT DETECTED Final    Comment: Performed at [facility], [address] [place], [place] [address]     Radiological Exams on Admission: CT ABDOMEN PELVIS WO CONTRAST  Result Date: 01/02/2021 CLINICAL DATA:  Renal failure.  Diarrhea.  Obstructive uropathy. EXAM: CT ABDOMEN AND PELVIS WITHOUT CONTRAST TECHNIQUE: Multidetector CT imaging of the abdomen and pelvis was performed following the standard protocol without IV contrast. COMPARISON:  None. FINDINGS: Lower chest: No acute abnormality. Hepatobiliary: No focal liver abnormality is seen. No gallstones, gallbladder wall thickening, or biliary dilatation. Pancreas: Unremarkable. No pancreatic ductal dilatation or surrounding inflammatory changes. Spleen: Normal in size without focal abnormality. Adrenals/Urinary Tract: Normal adrenal glands. No kidney mass or stones identified bilaterally. Right pelviectasis and mild right hydroureter to the level of the  urinary bladder identified. Posterior dome of bladder wall lesion measures 1.8 cm, image 69/6. A second lesion is noted along the posterior base measuring 5.9 cm,, image 58/6. This second lesion appears to obstruct the right UVJ, image 76/2. Both of these are suspicious for urothelial neoplasms. Stomach/Bowel: Stomach appears normal. The appendix is not confidently identified. Within the limitations of unenhanced technique there is diffuse colonic wall thickening with mild pericolonic inflammatory soft tissue stranding concerning for colitis. No signs of pneumatosis or bowel perforation. No evidence for bowel obstruction. Moderate stool burden identified within the sigmoid colon and rectum. Vascular/Lymphatic: Aortic atherosclerosis. No aneurysm. No abdominopelvic adenopathy. Reproductive: Prostate is unremarkable. Other: No abdominal wall hernia or abnormality. No abdominopelvic ascites. Musculoskeletal: No acute or significant osseous findings. Marked degenerative disc disease identified within the lumbar spine. This is most advanced at L4-5 and L5-S1. IMPRESSION: 1. Examination is positive for diffuse colonic wall thickening with mild pericolonic inflammatory soft tissue stranding concerning for colitis. No signs of pneumatosis or bowel perforation. 2. Right-sided pelviectasis and mild right hydroureter to the level of the urinary bladder. 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 3. Lumbar spondylosis. 4. Aortic atherosclerosis. Aortic Atherosclerosis (ICD10-I70.0). Electronically Signed   By: Kerby Moors M.D.   On: 01/02/2021 06:37   DG Chest Portable 1 View  Result Date: 01/02/2021 CLINICAL DATA:  70 year old male with diarrhea, fever, left foot wound. Diaphoresis. EXAM: PORTABLE CHEST 1 VIEW COMPARISON:  None. FINDINGS: Portable AP upright view at 0430 hours. Lung volumes and mediastinal contours are within normal limits. Visualized tracheal air column is  within normal limits. Calcified aortic atherosclerosis. Allowing for portable technique the lungs are clear. Somewhat symmetric skin fold artifacts suspected over both upper lobes. No pneumothorax or pleural effusion. No acute osseous abnormality identified. IMPRESSION: 1.  No acute cardiopulmonary abnormality. 2.  Aortic Atherosclerosis (ICD10-I70.0). Electronically Signed   By: Genevie Ann M.D.   On: 01/02/2021 04:57   DG Foot Complete Left  Result Date: 01/02/2021 CLINICAL DATA:  Sepsis, chronic left foot wound EXAM: LEFT FOOT - COMPLETE 3+ VIEW COMPARISON:  None. FINDINGS: Transmetatarsal amputation of the a fourth and fifth digits has been performed. A a probable deep ulcer is seen within the soft tissues of the residual left forefoot laterally anterior to the a residual fourth metatarsal. There is no subjacent osseous erosion to suggest osteomyelitis. No acute fracture or dislocation. Residual joint spaces are preserved. Advanced vascular calcifications are seen within the left foot. Large plantar calcaneal spur. 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 Anterolateral soft tissue ulceration. No superimposed osseous erosion. Electronically Signed   By: Fidela Salisbury MD   On: 01/02/2021 04:59     EKG: I have personally reviewed.  Atrial fibrillation, QTC 449, RAD, no T wave peaking  Assessment/Plan Principal Problem:   C. difficile colitis Active Problems:   Sepsis ([place])   Atrial fibrillation ([place])   Bladder cancer ([place])   COVID-19 virus infection   Diabetic infection of left foot ([place])   History of CVA (cerebrovascular accident)   Hyperlipidemia   Personal history of DVT (deep vein thrombosis)   Hyperkalemia   Hydroureter on right   CKD (chronic kidney disease), stage IV (HCC)   Chronic systolic CHF (congestive heart failure) (HCC)   Sepsis due to C. difficile colitis: Patient meets critical for sepsis with leukocytosis with WBC 16.4, tachycardia with heart  rate 120, tachypnea with RR 24.  Lactic acid is 1.7.  Currently hemodynamically stable  -Admitted to progressive bed as inpatient -Started Dificid -Follow-up of blood culture -will get Procalcitonin -IVF: 2L of LR bolus in ED, followed by 75 cc/h  -prn Zofran for nausea  Atrial fibrillation ([place]): HR 110-120s -Continue metoprolol and Eliquis  Bladder cancer and hydroureter on right: CT scan showed right-sided pelviectasis and mild right hydroureter to the level of the urinary bladder. 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 Patient is following up in [facility] urology. Since renal function has no significant change with creatinine 2.90 on 12/31/2020 --> 3.017.  I will not consult our urology. -Follow-up with urology in [facility]  COVID-19 virus infection: Asymptomatic. -As needed albuterol  Diabetic infection of left foot [facility]): S/p of amputation of fourth and fifth toe of left foot.  Seems to be healing well -Consult wound care  History of CVA (cerebrovascular accident) -Patient is on Eliquis for A. Fib  Hyperlipidemia: Patient not taking statin now -Follow-up with PCP  Personal history of DVT (deep vein thrombosis) -Eliquis  Hyperkalemia -10 g of leukoma was ordered by ED physician -Patient was given D50 and 3 unit of NovoLog  CKD (chronic kidney disease), stage IV ([place]): Renal function close to baseline.  Creatinine 2.90 on 12/31/2020, today is 3.17, BUN 87 today -Follow-up renal function with BMP  Chronic systolic CHF (congestive heart failure) ([place]): 2D echo 04/19/2019 showed EF of 45 to 50%.  Patient does not have leg edema.  No respiratory distress.  Chest x-ray negative for pulmonary edema.  BNP 283.  CHF seem to be compensated. -Watch volume status closely     DVT ppx: on Eliquis Code Status: Full code Family Communication: not done, no family member is at bed side.   Disposition Plan:  Anticipate discharge back to previous  environment Consults called:  none Admission status and Level of care: Progressive Cardiac: as inpt     Status is: Inpatient  Remains inpatient appropriate because:Inpatient level of care appropriate due to severity of illness  Dispo:  The patient is from: Home              Anticipated d/c is to: Home              Patient currently is not medically stable to d/c.   Difficult to place patient No           Date of Service 01/02/2021    [facility]   If 7PM-7AM, please contact night-coverage [email or web address] 01/02/2021, 5:30 PM

## 2021-01-02 NOTE — ED Notes (Signed)
Pt vomited multiple times in floor as I came in for assessment. Cleaned him up. He then had an episode of diarrhea and cleaned that up and placed new brief.

## 2021-01-02 NOTE — ED Notes (Signed)
Crystal RN aware of assigned bed

## 2021-01-03 DIAGNOSIS — A0472 Enterocolitis due to Clostridium difficile, not specified as recurrent: Secondary | ICD-10-CM | POA: Diagnosis not present

## 2021-01-03 LAB — BASIC METABOLIC PANEL
Anion gap: 8 (ref 5–15)
BUN: 77 mg/dL — ABNORMAL HIGH (ref 8–23)
CO2: 19 mmol/L — ABNORMAL LOW (ref 22–32)
Calcium: 8.1 mg/dL — ABNORMAL LOW (ref 8.9–10.3)
Chloride: 105 mmol/L (ref 98–111)
Creatinine, Ser: 2.84 mg/dL — ABNORMAL HIGH (ref 0.61–1.24)
GFR, Estimated: 23 mL/min — ABNORMAL LOW (ref >60)
Glucose, Bld: 123 mg/dL — ABNORMAL HIGH (ref 70–99)
Potassium: 4.2 mmol/L (ref 3.5–5.1)
Sodium: 132 mmol/L — ABNORMAL LOW (ref 135–145)

## 2021-01-03 LAB — HIV ANTIBODY (ROUTINE TESTING W REFLEX): HIV Screen 4th Generation wRfx: NONREACTIVE

## 2021-01-03 LAB — GLUCOSE, CAPILLARY
Glucose-Capillary: 113 mg/dL — ABNORMAL HIGH (ref 70–99)
Glucose-Capillary: 179 mg/dL — ABNORMAL HIGH (ref 70–99)
Glucose-Capillary: 133 mg/dL — ABNORMAL HIGH (ref 70–99)
Glucose-Capillary: 155 mg/dL — ABNORMAL HIGH (ref 70–99)

## 2021-01-03 MED ORDER — SODIUM CHLORIDE 0.9 % IV SOLN: INTRAVENOUS | Status: DC

## 2021-01-03 NOTE — Progress Notes (Signed)
PROGRESS NOTE  Christopher Davis  DOB: 1950/07/24  PCP: Jill Alexanders, MD QHU:[id number]  [place]: 01/02/2021  LOS: 1 day  Hospital Day: 2  Chief complaint: Diarrhea   Brief narrative: Christopher Davis is a 70 y.o. 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 Patient had osteomyelitis of the left foot for which he underwent amputation of fourth and fifth toe on 10/28/2020.  He was treated with broad-spectrum antibiotic at that time.  The wound has been healing well but patient has developed diarrhea which has been going on for the last 4 weeks with several bouts of watery bowel movements every day.  In the ED, patient had a temperature of 98.9, heart rate elevated to 120, respiratory rate elevated to 22. WBC count elevated to 16.4, lactic acid 1.7, sodium 134, potassium 5.7, BUN/creatinine elevated to 87/3.17 Chest x-ray negative for any infiltration. X-ray of the left foot showed anterolateral soft tissue ulceration without any bony abnormality. Stool assay found C. difficile test positive for antigen and toxin, negative GI pathogen panel CT abdomen and pelvis showed diffuse colonic wall thickening with mild pericolonic inflammatory soft tissue stranding concerning for colitis without evidence of pneumatosis or bowel perforation.  Subjective: Patient was seen and examined this morning.  Pleasant elderly Caucasian male.  Propped up in bed.  Not in distress with no new symptoms.  Had 3-4 episode of bowel movement in last 24 hours with loose stool.  Assessment/Plan: Sepsis due to C. difficile colitis -Presented with 4 weeks of diarrhea in the setting of recent use of antibiotics for  osteomyelitis -C. difficile positive in stool -Met sepsis criteria on admission with leukocytosis, tachycardia, tachypnea -Blood cultures sent.  Patient was started on Dificid.  Continue the same. -Continue IV hydration -Continue to monitor diarrhea loss of fluid and electrolytes Recent Labs  Lab 01/02/21 0409  WBC 16.4*  LATICACIDVEN 1.7   Hyperkalemia -Potassium level was elevated to 5.7, Lokelma was given in the ED.  Subsequently down to 4.2 this morning.   Recent Labs  Lab 01/02/21 0409 01/03/21 0425  K 5.7* 4.2  MG 1.7  --    Type 2 diabetes mellitus -A1c 7.7 on 12/28/2020 at [facility] meds include Lantus 10 units at bedtime. -Currently on 5 units at bedtime with sliding scale insulin and Accu-Cheks -Blood sugar level seems controlled. Recent Labs  Lab 01/02/21 1145 01/02/21 1706 01/02/21 1943 01/03/21 0808 01/03/21 1220  GLUCAP 250* 150* 140* 113* 179*   Stage IV CKD -Baseline creatinine 2.9 on 7/28.  Slightly elevated to 3.17 this admission, improving now. Recent Labs    01/02/21 0409 01/03/21 0425  BUN 87* 77*  CREATININE 3.17* 2.84*   Chronic systolic CHF -Echo from November 2020 with EF 45 to 50% -CHF remains compensated at this time.    Atrial fibrillation  History of CVA/hyperlipidemia History of DVT -Continue metoprolol and Eliquis.  Not on statin  Recent COVID-19 infection -Tested positive on 7/28.  Asymptomatic. -Continue to monitor respiratory status  Recent diabetic foot infection of left foot Stage II pressure ulcer on the left heel -s/p of amputation of fourth and fifth toe of left foot. Seems to be healing well. -Wound care consult appreciated. -Follows  up with [facility] vascular surgery   Bladder cancer and hydroureter on right: CT scan showed right-sided pelviectasis and mild right hydroureter to the level of the urinary bladder. 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 Patient is following up  in [facility] urology.  Continue outpatient follow-up plan.    Mobility: PT eval ordered Code Status:   Code Status: Full Code  Nutritional status: Body mass index is 24.42 kg/m.     Diet:  Diet Order             Diet full liquid Room service appropriate? Yes; Fluid consistency: Thin  Diet effective now                  DVT prophylaxis:  apixaban (ELIQUIS) tablet 5 mg   Antimicrobials: Fidaxomicin Fluid: I will initiate patient on IV hydration and monitor for CHF exacerbation Consultants: None Family Communication: None at bedside  Status is: Inpatient  Remains inpatient appropriate because: C. difficile colitis  Dispo: The patient is from: Home              Anticipated d/c is to: Pending PT eval              Patient currently is not medically stable to d/c.   Difficult to place patient No     Infusions:   sodium chloride 75 mL/hr at 01/03/21 1137   famotidine (PEPCID) IV Stopped (01/02/21 1742)    Scheduled Meds:  apixaban  5 mg Oral BID   fidaxomicin  200 mg Oral BID   insulin aspart  0-5 Units Subcutaneous QHS   insulin aspart  0-9 Units Subcutaneous TID WC   insulin glargine-yfgn  5 Units Subcutaneous QHS   metoprolol succinate  200 mg Oral Daily    Antimicrobials: Anti-infectives (From admission, onward)    Start     Dose/Rate Route Frequency Ordered Stop   01/02/21 0715  fidaxomicin (DIFICID) tablet 200 mg        200 mg Oral 2 times daily 01/02/21 0655 01/12/21 0959       PRN meds: acetaminophen, albuterol, dextromethorphan-guaiFENesin, dextrose, ondansetron (ZOFRAN) IV   Objective: Vitals:   01/03/21 0812 01/03/21 1209  BP: 93/78 100/76  Pulse: 93 91  Resp: 18 18  Temp: 98.4 F (36.9 C) 98.3 F (36.8 C)  SpO2: 100% 99%    Intake/Output Summary (Last 24 hours) at 01/03/2021 1319 Last data filed at 01/03/2021 1015 Gross per 24 hour  Intake 960.35 ml  Output 375 ml  Net 585.35 ml   Filed Weights   01/02/21 0400 01/03/21 0329  Weight:  69.9 kg 75 kg   Weight change: 5.146 kg Body mass index is 24.42 kg/m.   Physical Exam: General exam: Pleasant, elderly Caucasian male.  Not in distress Skin: No rashes, lesions or ulcers. HEENT: Atraumatic, normocephalic, no obvious bleeding Lungs: Clear to auscultation bilaterally CVS: Regular rate and rhythm, no murmur GI/Abd soft, nontender, nondistended, bowel sound present CNS: Alert, awake, oriented to place and person Psychiatry: Mood appropriate Extremities: No pedal edema, no calf tenderness  Data Review: I have personally reviewed the laboratory data and studies available.  Recent Labs  Lab 01/02/21 0409  WBC 16.4*  NEUTROABS 15.4*  HGB 12.1*  HCT 36.5*  MCV 78.2*  PLT 147*   Recent Labs  Lab 01/02/21 0409 01/03/21 0425  NA 134* 132*  K 5.7* 4.2  CL 100 105  CO2 22 19*  GLUCOSE 207* 123*  BUN 87* 77*  CREATININE 3.17* 2.84*  CALCIUM 9.0 8.1*  MG 1.7  --     F/u labs ordered Unresulted Labs (From admission, onward)     Start     Ordered   01/04/21 0500  CBC with Differential/Platelet  Daily,   R     Question:  Specimen collection method  Answer:  Lab=Lab collect   01/03/21 0912   01/04/21 5041  Basic metabolic panel  Daily,   R     Question:  Specimen collection method  Answer:  Lab=Lab collect   01/03/21 0912   01/04/21 0500  Phosphorus  Tomorrow morning,   R       Question:  Specimen collection method  Answer:  Lab=Lab collect   01/03/21 0912   01/04/21 0500  Magnesium  Tomorrow morning,   R       Question:  Specimen collection method  Answer:  Lab=Lab collect   01/03/21 0912   01/03/21 0555  CBC  Once,   STAT        01/03/21 0555            Signed, Terrilee Croak, MD [facility] 01/03/2021

## 2021-01-03 NOTE — Plan of Care (Cosign Needed)
  Problem: Education: Goal: Knowledge of risk factors and measures for prevention of condition will improve Outcome: Progressing   Problem: Respiratory: Goal: Will maintain a patent airway Outcome: Progressing Goal: Complications related to the disease process, condition or treatment will be avoided or minimized Outcome: Progressing   Problem: Coping: Goal: Psychosocial and spiritual needs will be supported Outcome: Progressing

## 2021-01-04 ENCOUNTER — Other Ambulatory Visit (HOSPITAL_COMMUNITY): Payer: Self-pay

## 2021-01-04 DIAGNOSIS — A0472 Enterocolitis due to Clostridium difficile, not specified as recurrent: Secondary | ICD-10-CM | POA: Diagnosis not present

## 2021-01-04 LAB — CBC WITH DIFFERENTIAL/PLATELET
Abs Immature Granulocytes: 0.02 10*3/uL (ref 0.00–0.07)
Basophils Absolute: 0 10*3/uL (ref 0.0–0.1)
Basophils Relative: 1 %
Eosinophils Absolute: 0.1 10*3/uL (ref 0.0–0.5)
Eosinophils Relative: 2 %
HCT: 30.2 % — ABNORMAL LOW (ref 39.0–52.0)
Hemoglobin: 9.8 g/dL — ABNORMAL LOW (ref 13.0–17.0)
Immature Granulocytes: 0 %
Lymphocytes Relative: 11 %
Lymphs Abs: 0.7 10*3/uL (ref 0.7–4.0)
MCH: 26.1 pg (ref 26.0–34.0)
MCHC: 32.5 g/dL (ref 30.0–36.0)
MCV: 80.5 fL (ref 80.0–100.0)
Monocytes Absolute: 0.7 10*3/uL (ref 0.1–1.0)
Monocytes Relative: 12 %
Neutro Abs: 4.6 10*3/uL (ref 1.7–7.7)
Neutrophils Relative %: 74 %
Platelets: 101 10*3/uL — ABNORMAL LOW (ref 150–400)
RBC: 3.75 MIL/uL — ABNORMAL LOW (ref 4.22–5.81)
RDW: 15.6 % — ABNORMAL HIGH (ref 11.5–15.5)
WBC: 6.2 10*3/uL (ref 4.0–10.5)
nRBC: 0 % (ref 0.0–0.2)

## 2021-01-04 LAB — BASIC METABOLIC PANEL
Anion gap: 10 (ref 5–15)
BUN: 70 mg/dL — ABNORMAL HIGH (ref 8–23)
CO2: 21 mmol/L — ABNORMAL LOW (ref 22–32)
Calcium: 7.9 mg/dL — ABNORMAL LOW (ref 8.9–10.3)
Chloride: 102 mmol/L (ref 98–111)
Creatinine, Ser: 2.67 mg/dL — ABNORMAL HIGH (ref 0.61–1.24)
GFR, Estimated: 25 mL/min — ABNORMAL LOW (ref >60)
Glucose, Bld: 99 mg/dL (ref 70–99)
Potassium: 3.8 mmol/L (ref 3.5–5.1)
Sodium: 133 mmol/L — ABNORMAL LOW (ref 135–145)

## 2021-01-04 LAB — URINE CULTURE: Culture: 60000 — AB

## 2021-01-04 LAB — PHOSPHORUS: Phosphorus: 3.6 mg/dL (ref 2.5–4.6)

## 2021-01-04 LAB — GLUCOSE, CAPILLARY
Glucose-Capillary: 89 mg/dL (ref 70–99)
Glucose-Capillary: 111 mg/dL — ABNORMAL HIGH (ref 70–99)
Glucose-Capillary: 181 mg/dL — ABNORMAL HIGH (ref 70–99)
Glucose-Capillary: 273 mg/dL — ABNORMAL HIGH (ref 70–99)

## 2021-01-04 LAB — MAGNESIUM: Magnesium: 1.7 mg/dL (ref 1.7–2.4)

## 2021-01-04 MED ORDER — ENSURE ENLIVE PO LIQD
237.0000 mL | Freq: Three times a day (TID) | ORAL | Status: DC
  Administered 2021-01-04 – 2021-01-07 (×8): 237 mL via ORAL

## 2021-01-04 MED ORDER — ADULT MULTIVITAMIN W/MINERALS CH
1.0000 | ORAL_TABLET | Freq: Every day | ORAL | Status: DC
  Administered 2021-01-05 – 2021-01-07 (×3): 1 via ORAL
  Filled 2021-01-04 (×3): qty 1

## 2021-01-04 MED ORDER — ASCORBIC ACID 500 MG PO TABS
250.0000 mg | ORAL_TABLET | Freq: Two times a day (BID) | ORAL | Status: DC
  Administered 2021-01-04 – 2021-01-07 (×6): 250 mg via ORAL
  Filled 2021-01-04 (×6): qty 1

## 2021-01-04 NOTE — Clinical Social Work Note (Cosign Needed)
    Durable Medical Equipment  (From admission, onward)           Start     Ordered   01/04/21 1510  For home use only DME Hospital bed  Once       Question Answer Comment  Length of Need Lifetime   Bed type Semi-electric   Support Surface: Gel Overlay      01/04/21 1510           Patient has CHF which requires head of bed to be positioned in ways not feasible with a normal bed. Head must be elevated at least 30 degrees or unable to obtain adequate respiratory status.

## 2021-01-04 NOTE — TOC Initial Note (Signed)
Transition of Care [facility]) - Initial/Assessment Note    Patient Details  Name: Christopher Davis MRN: [id number] Date of Birth: [birth year]  Transition of Care [facility]) CM/SW Contact:    Eileen Stanford, LCSW Phone Number: 01/04/2021, 2:14 PM  Clinical Narrative:   CSW spoke with pt via telephone. Pt is in a air/con precaution room. Pt states he lives at home with his spouse. Pt states he takes himself to his doctor appointments. Pt states his primary care physician is Dr Berneice Heinrich however he has never seen him at the practice. He says he typically sees NP's. Pt states its at a clinic in [place]. Pt states he gest his meds filled at [facility] in [facility] with no difficulties. Pt states he doesn't anticipate any needs at d/c. However, pt would like a hospital bed because it is hard for him to get in and out of bed with two amputations. Pt would like for [employer] to run it to see if insurance would cover any of the cost. Referral given to [facility] with [employer].                Expected Discharge Plan: Home/Self Care Barriers to Discharge: Continued Medical Work up   Patient Goals and CMS Choice Patient states their goals for this hospitalization and ongoing recovery are:: to go home      Expected Discharge Plan and Services Expected Discharge Plan: Home/Self Care In-house Referral: NA   Post Acute Care Choice: NA Living arrangements for the past 2 months: Single Family Home                                      Prior Living Arrangements/Services Living arrangements for the past 2 months: Single Family Home Lives with:: Spouse Patient language and need for interpreter reviewed:: Yes Do you feel safe going back to the place where you live?: Yes      Need for Family Participation in Patient Care: Yes (Comment) Care giver support system in place?: Yes (comment)   Criminal Activity/Legal Involvement Pertinent to Current Situation/Hospitalization: No - Comment as needed  Activities of  Daily Living Home Assistive Devices/Equipment: None ADL Screening (condition at time of admission) Patient's cognitive ability adequate to safely complete daily activities?: Yes Is the patient deaf or have difficulty hearing?: No Does the patient have difficulty seeing, even when wearing glasses/contacts?: No Does the patient have difficulty concentrating, remembering, or making decisions?: No Patient able to express need for assistance with ADLs?: Yes Does the patient have difficulty dressing or bathing?: No Independently performs ADLs?: Yes (appropriate for developmental age) Does the patient have difficulty walking or climbing stairs?: No Weakness of Legs: Both Weakness of Arms/Hands: None  Permission Sought/Granted Permission sought to share information with : Family Supports Permission granted to share information with : Yes, Verbal Permission Granted  Share Information with NAME: Darlene     Permission granted to share info w Relationship: spouse     Emotional Assessment Appearance:: Appears stated age Attitude/Demeanor/Rapport: Engaged Affect (typically observed): Accepting Orientation: : Oriented to Self, Oriented to Place, Oriented to  Time, Oriented to Situation Alcohol / Substance Use: Not Applicable Psych Involvement: No (comment)  Admission diagnosis:  Hyperkalemia [E87.5] C. difficile colitis [A04.72] Acute renal failure, unspecified acute renal failure type ([place]) [N17.9] Sepsis with acute renal failure without septic shock, due to unspecified organism, unspecified acute renal failure type ([place]) [A41.9, R65.20,  N17.9] Patient Active Problem List   Diagnosis Date Noted   C. difficile colitis 01/02/2021   Sepsis ([place]) 01/02/2021   Atrial fibrillation (HCC)    Bladder cancer (HCC)    Diabetic infection of left foot ([place])    History of CVA (cerebrovascular accident)    Hyperlipidemia    Personal history of DVT (deep vein thrombosis)    Hyperkalemia     Hydroureter on right    CKD (chronic kidney disease), stage IV (HCC)    Chronic systolic CHF (congestive heart failure) ([place])    COVID-19 virus infection 12/28/2020   PCP:  Jill Alexanders, MD Pharmacy:  No Pharmacies Listed    Social Determinants of Health (SDOH) Interventions    Readmission Risk Interventions Readmission Risk Prevention Plan 01/04/2021  Transportation Screening Complete  PCP or Specialist Appt within 3-5 Days Complete  HRI or [place] Complete  Social Work Consult for [place] Planning/Counseling Complete  Palliative Care Screening Complete  Medication Review Press photographer) Complete

## 2021-01-04 NOTE — TOC Benefit Eligibility Note (Signed)
Patient Advocate Encounter  Prior Authorization for Vancomycin 125 mg has been approved.     Effective dates: 01/04/2021 through 06/05/2021  Patients co-pay is $95.00.     Lyndel Safe, [place] Patient Advocate Specialist [place] Antimicrobial Stewardship Team Direct Number: [phone]  Fax: [phone]

## 2021-01-04 NOTE — Progress Notes (Signed)
Initial Nutrition Assessment  DOCUMENTATION CODES:   Non-severe (moderate) malnutrition in context of chronic illness  INTERVENTION:   Ensure Enlive po TID, each supplement provides 350 kcal and 20 grams of protein  MVI po daily   Vitamin C 256m po BID   NUTRITION DIAGNOSIS:   Moderate Malnutrition related to chronic illness (bladder cancer) as evidenced by moderate fat depletion, moderate muscle depletion.  GOAL:   Patient will meet greater than or equal to 90% of their needs  MONITOR:   PO intake, Supplement acceptance, Labs, Weight trends, Skin, I & O's  REASON FOR ASSESSMENT:   Malnutrition Screening Tool    ASSESSMENT:   70y.o. male with medical history significant of bladder cancer, hyperlipidemia, diabetes mellitus, stroke with mild left-sided weakness, DVT and A. fib on Eliquis, sCHF with EF 45-50%, osteomyelitis of left foot (s/p of amputation of 4th and 5th toe), CKD-IV and COVID-19 infection 12/28/2020 who presents with C-diff infection and diarrhea.  Met with pt in room today. Pt reports fair appetite and oral intake pta and in hospital. Pt reports that he ate fairly well in hospital today. Pt is documented to have eaten <50% of meals over the past two days. Per chart, pt is down 7lbs(5%) over the past 2 months; this is not significant. RD discussed with pt the importance of adequate nutrition needed to preserve lean muscle and to support wound healing. RD will add supplements and MVI to help pt meet his estimated needs. Pt would like to have chocolate supplements in hospital. RD will monitor potassium, can switch to vanilla Nepro if needed.   Medications reviewed and include: dificid, insulin, pepcid  Labs reviewed: Na 133(L), K 3.8 wnl, BUN 70(H), creat 2.67(H), P 3.6 wnl, Mg 1.7 wnl Hgb 9.8(L), Hct 30.2(L) Cbgs- 89, 111 x 24 hrs  NUTRITION - FOCUSED PHYSICAL EXAM:  Flowsheet Row Most Recent Value  Orbital Region No depletion  Upper Arm Region Moderate  depletion  Thoracic and Lumbar Region Mild depletion  Buccal Region Mild depletion  Temple Region No depletion  Clavicle Bone Region Mild depletion  Clavicle and Acromion Bone Region Mild depletion  Scapular Bone Region Mild depletion  Dorsal Hand Mild depletion  Patellar Region Moderate depletion  Anterior Thigh Region Moderate depletion  Posterior Calf Region Moderate depletion  Edema (RD Assessment) Mild  Hair Reviewed  Eyes Reviewed  Mouth Reviewed  Skin Reviewed  Nails Reviewed   Diet Order:   Diet Order             Diet regular Room service appropriate? Yes; Fluid consistency: Thin  Diet effective now                  EDUCATION NEEDS:   Education needs have been addressed  Skin:  Skin Assessment: Reviewed RN Assessment (Stage 2 pressure injury to left heel)  Last BM:  8/1- TYPE 7  Height:   Ht Readings from Last 1 Encounters:  01/02/21 5' 9" (1.753 m)    Weight:   Wt Readings from Last 1 Encounters:  01/04/21 76 kg    Ideal Body Weight:  72.7 kg  BMI:  Body mass index is 24.74 kg/m.  Estimated Nutritional Needs:   Kcal:  2000-2300kcal/day  Protein:  100-115g/day  Fluid:  1.9-2.2L/day  CKoleen DistanceMS, RD, LDN Please refer to [facility] and/or RD on-call/weekend/after hours pager

## 2021-01-04 NOTE — Evaluation (Signed)
Physical Therapy Evaluation Patient Details Name: Christopher Davis MRN: [id number] DOB: [birth year] Today's Date: 01/04/2021   History of Present Illness  Christopher Davis is a 70 y.o. 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    Clinical Impression  Pt received in Semi-Fowler's position and with encouragement, agreeable to therapy.  Pt notes that prior to hospitalization, pt was able to function and perform all ADL's with no AD, unless he was ambulating on uneven terrain in which he uses a staff.  Pt also notes that he does not have his Prevalon boots with him due to being brought by EMS and they did not grab them.  Pt is able to perform bed-level exercises, with difficulty performing on the L LE due to an old stroke according to pt.  Pt then proceeded to perform bed mobility and is able to do so, but is unsteady in standing.  Pt refuses assistance from therapist and demonstrates decreased safety awareness specifically with transfer to [facility].  Pt had uncontrolled descent to the [facility] and then requested for therapist to leave and find his nursing staff.  Therapist offered to assist pt with self-care, however he requested nursing staff instead.  Nursing notified of situation with transfer and request for toileting needs.  Nursing also notified that in order to ambulate, pt would require Prevalon boots per MD order via secure chat.  Pt will benefit from skilled PT intervention to increase independence and safety with basic mobility in preparation for discharge to the venue listed below.       Follow Up Recommendations Home health PT    Equipment Recommendations  Rolling walker with 5" wheels     Recommendations for Other Services       Precautions / Restrictions        Mobility  Bed Mobility Overal bed mobility: Modified Independent                  Transfers Overall transfer level: Needs assistance Equipment used: None Transfers: Sit to/from Stand;Stand Pivot Transfers Sit to Stand: Supervision Stand pivot transfers: Supervision;Min guard       General transfer comment: PT requests to not be helped, but has some instability while in standing.  Ambulation/Gait   Gait Distance (Feet): 2 Feet Assistive device: None       General Gait Details: Pt able to take a step towards the [facility], however has some instability and has uncontrolled descent to the [facility].  Pt should likely be using some for of AD in the future sessions.  Stairs            Wheelchair Mobility    Modified Rankin (Stroke Patients Only)       Balance Overall balance assessment: Needs assistance Sitting-balance support: Feet supported;No upper extremity supported Sitting balance-Leahy Scale: Good     Standing balance support: No upper extremity supported Standing balance-Leahy Scale: Fair Standing balance comment: Pt has mild instability of standing balance noted.                             Pertinent Vitals/Pain Pain Assessment: No/denies pain    Home Living Family/patient expects to be discharged  to:: Private residence Living Arrangements: Spouse/significant other Available Help at Discharge: Family;Available 24 hours/day Type of Home: House Home Access: Stairs to enter Entrance Stairs-Rails: Left Entrance Stairs-Number of Steps: 2 Home Layout: One level Home Equipment: Other (comment) Additional Comments: Uses a staff on uneven ground, but otherwise ambulates without any AD.    Prior Function Level of Independence: Independent;Independent with assistive device(s)               Hand Dominance   Dominant Hand: Right    Extremity/Trunk Assessment                 Communication   Communication: No difficulties  Cognition Arousal/Alertness: Awake/alert Behavior During Therapy: WFL for tasks assessed/performed Overall Cognitive Status: Within Functional Limits for tasks assessed                                 General Comments: A&O x4.      General Comments      Exercises Total Joint Exercises Ankle Circles/Pumps: AROM;Strengthening;Both;10 reps;Supine Quad Sets: AROM;Strengthening;Both;10 reps;Supine Gluteal Sets: AROM;Strengthening;Both;10 reps;Supine Heel Slides: AROM;Strengthening;Both;10 reps;Supine Hip ABduction/ADduction: AROM;Strengthening;Both;10 reps;Supine Straight Leg Raises: AROM;Strengthening;Both;10 reps;Supine Long Arc Quad: AROM;Strengthening;Both;10 reps;Seated Other Exercises Other Exercises: Pt educated on roles of PT and the services provided during hospital stay.  Pt also educated on the importance of exercises in order to prevent muscle atrophy.   Assessment/Plan    PT Assessment Patient needs continued PT services  PT Problem List Decreased strength;Decreased activity tolerance;Decreased balance;Decreased mobility;Decreased knowledge of use of DME;Decreased safety awareness       PT Treatment Interventions DME instruction;Gait training;Stair training;Functional mobility training;Therapeutic activities;Therapeutic exercise;Balance training    PT Goals (Current goals can be found in the Care Plan section)  Acute Rehab PT Goals Patient Stated Goal: To go home. PT Goal Formulation: With patient Time For Goal Achievement: 01/18/21 Potential to Achieve Goals: Good    Frequency Min 2X/week   Barriers to discharge        Co-evaluation               AM-PAC PT "6 Clicks" Mobility  Outcome Measure Help needed turning from your back to your side while in a flat bed without using bedrails?: A Little Help needed moving from lying on your back to sitting on the side of a flat  bed without using bedrails?: A Little Help needed moving to and from a bed to a chair (including a wheelchair)?: A Little Help needed standing up from a chair using your arms (e.g., wheelchair or bedside chair)?: A Little Help needed to walk in hospital room?: A Lot Help needed climbing 3-5 steps with a railing? : A Lot 6 Click Score: 16    End of Session   Activity Tolerance: Patient tolerated treatment well Patient left: with call bell/phone within reach;Other (comment) (Pt left on BSC per request and for nursing to come and assist.) Nurse Communication: Mobility status;Other (comment) (Nurse notified of pt being on [facility] and also of need for Prevalon boots to be order for ambulation with pt.) PT Visit Diagnosis: Unsteadiness on feet (R26.81);Other abnormalities of gait and mobility (R26.89);Muscle weakness (generalized) (M62.81);History of falling (Z91.81);Difficulty in walking, not elsewhere classified (R26.2)    Time: 1610-9604 PT Time Calculation (min) (ACUTE ONLY): 41 min   Charges:   PT Evaluation $PT Eval Low Complexity: 1 Low PT Treatments $Gait Training: 8-22 mins $Therapeutic Exercise: 23-37 mins  Gwenlyn Saran, PT, DPT 01/04/21, 2:47 PM   Christie Nottingham 01/04/2021, 2:41 PM

## 2021-01-04 NOTE — Progress Notes (Signed)
PROGRESS NOTE  Christopher Davis  DOB: 11/24/1950  PCP: Jill Alexanders, MD DXI:[id number]  DOA: 01/02/2021  LOS: 2 days  Hospital Day: 3  Chief complaint: Diarrhea   Brief narrative: Christopher Davis is a 70 y.o. 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 Patient had osteomyelitis of the left foot for which he underwent amputation of fourth and fifth toe on 10/28/2020.  He was treated with broad-spectrum antibiotic at that time.  The wound has been healing well but patient has developed diarrhea which has been going on for the last 4 weeks with several bouts of watery bowel movements every day.  In the ED, patient had a temperature of 98.9, heart rate elevated to 120, respiratory rate elevated to 22. WBC count elevated to 16.4, lactic acid 1.7, sodium 134, potassium 5.7, BUN/creatinine elevated to 87/3.17 Chest x-ray negative for any infiltration. X-ray of the left foot showed anterolateral soft tissue ulceration without any bony abnormality. Stool assay found C. difficile test positive for antigen and toxin, negative GI pathogen panel CT abdomen and pelvis showed diffuse colonic wall thickening with mild pericolonic inflammatory soft tissue stranding concerning for colitis without evidence of pneumatosis or bowel perforation. Admitted to hospitalist service See below for details  Subjective: Patient was seen and examined this morning.  Lying down in bed.  Not in distress.  Feels better.  Diarrhea frequency and stool consistency seems to be improving.  4 episodes of bowel movement in last 24 hours.  Getting soft in consistency now.    Assessment/Plan: Sepsis due to C. difficile  colitis -Presented with 4 weeks of diarrhea in the setting of recent use of antibiotics for osteomyelitis -C. difficile positive in stool -Met sepsis criteria on admission with leukocytosis, tachycardia, tachypnea -Currently patient is improving on Dificid. -Continue IV hydration -Continue to monitor diarrhea loss of fluid and electrolytes Recent Labs  Lab 01/02/21 0409 01/04/21 0425  WBC 16.4* 6.2  LATICACIDVEN 1.7  --     Hyperkalemia -Potassium level initially was elevated to 5.7, Lokelma was given in the ED.  Subsequently down to normal range.  Continue to monitor electrolytes with diarrhea. Recent Labs  Lab 01/02/21 0409 01/03/21 0425 01/04/21 0425  K 5.7* 4.2 3.8  MG 1.7  --  1.7  PHOS  --   --  3.6   Type 2 diabetes mellitus -A1c 7.7 on 12/28/2020 at [facility] meds include Lantus 10 units at bedtime. -Currently on 5 units at bedtime with sliding scale insulin and Accu-Cheks -Blood sugar level seems controlled.  Patient wants to be on regular diet today. Recent Labs  Lab 01/03/21 0808 01/03/21 1220 01/03/21 1710 01/03/21 1933 01/04/21 0742  GLUCAP 113* 179* 133* 155* 89    Stage IV CKD -Baseline creatinine 2.9 on 7/28.  Slightly elevated to 3.17 this admission, gradually improving now.  Continue IV fluid. Recent Labs    01/02/21 0409 01/03/21 0425 01/04/21 0425  BUN 87* 77* 70*  CREATININE 3.17* 2.84* 2.67*   Chronic systolic CHF -Echo from November 2020 with EF 45 to 50% -CHF remains compensated at this time.   -Watch out for CHF exacerbation while on IV hydration.  Atrial fibrillation  History of CVA/hyperlipidemia History  of DVT -Continue metoprolol and Eliquis.  Not on statin  Recent COVID-19 infection -Tested positive on 7/28.  Asymptomatic. -Continue to monitor respiratory status  Recent diabetic foot infection of left foot Stage II pressure ulcer on the left heel -s/p of amputation of fourth and fifth toe of left foot. Seems to be healing  well. -Wound care consult appreciated. -Follows up with [facility] vascular surgery   Bladder cancer and hydroureter on right: CT scan showed right-sided pelviectasis and mild right hydroureter to the level of the urinary bladder. 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 Patient is following up in [facility] urology.  Continue outpatient follow-up plan.    Mobility: PT eval ordered Code Status:   Code Status: Full Code  Nutritional status: Body mass index is 24.74 kg/m.     Diet:  Diet Order             Diet regular Room service appropriate? Yes; Fluid consistency: Thin  Diet effective now                  DVT prophylaxis:  apixaban (ELIQUIS) tablet 5 mg   Antimicrobials: Fidaxomicin Fluid: Normal saline at 75 mill per hour Consultants: None Family Communication: None at bedside  Status is: Inpatient  Remains inpatient appropriate because: C. difficile colitis  Dispo: The patient is from: Home              Anticipated d/c is to: Pending PT eval              Patient currently is not medically stable to d/c.   Difficult to place patient No     Infusions:   sodium chloride 75 mL/hr at 01/04/21 0229   famotidine (PEPCID) IV 20 mg (01/03/21 2222)    Scheduled Meds:  apixaban  5 mg Oral BID   fidaxomicin  200 mg Oral BID   insulin aspart  0-5 Units Subcutaneous QHS   insulin aspart  0-9 Units Subcutaneous TID WC   insulin glargine-yfgn  5 Units Subcutaneous QHS   metoprolol succinate  200 mg Oral Daily    Antimicrobials: Anti-infectives (From admission, onward)    Start     Dose/Rate Route Frequency Ordered Stop   01/02/21 0715  fidaxomicin (DIFICID) tablet 200 mg        200 mg Oral 2 times daily 01/02/21 0655 01/12/21 0959       PRN meds: acetaminophen, albuterol, dextromethorphan-guaiFENesin, dextrose, ondansetron (ZOFRAN) IV   Objective: Vitals:   01/04/21 0447 01/04/21 0737  BP: 115/62 115/70  Pulse: (!) 101 (!)  103  Resp: 16 19  Temp: 98 F (36.7 C) 97.7 F (36.5 C)  SpO2: 98% 100%    Intake/Output Summary (Last 24 hours) at 01/04/2021 0938 Last data filed at 01/04/2021 0918 Gross per 24 hour  Intake 1200 ml  Output 2325 ml  Net -1125 ml    Filed Weights   01/02/21 0400 01/03/21 0329 01/04/21 0500  Weight: 69.9 kg 75 kg 76 kg   Weight change: 1 kg Body mass index is 24.74 kg/m.   Physical Exam: General exam: Pleasant, elderly Caucasian male.  Not in distress Skin: No rashes, lesions or ulcers. HEENT: Atraumatic, normocephalic, no obvious bleeding Lungs: Clear to auscultation bilaterally CVS: Regular rate and rhythm, no murmur GI/Abd soft, nontender, nondistended, bowel sound present CNS: Alert, awake, oriented to place and person Psychiatry: Mood appropriate Extremities: No pedal edema, no calf tenderness  Data Review: I have  personally reviewed the laboratory data and studies available.  Recent Labs  Lab 01/02/21 0409 01/04/21 0425  WBC 16.4* 6.2  NEUTROABS 15.4* 4.6  HGB 12.1* 9.8*  HCT 36.5* 30.2*  MCV 78.2* 80.5  PLT 147* 101*    Recent Labs  Lab 01/02/21 0409 01/03/21 0425 01/04/21 0425  NA 134* 132* 133*  K 5.7* 4.2 3.8  CL 100 105 102  CO2 22 19* 21*  GLUCOSE 207* 123* 99  BUN 87* 77* 70*  CREATININE 3.17* 2.84* 2.67*  CALCIUM 9.0 8.1* 7.9*  MG 1.7  --  1.7  PHOS  --   --  3.6     F/u labs ordered Unresulted Labs (From admission, onward)     Start     Ordered   01/04/21 0500  CBC with Differential/Platelet  Daily,   R     Question:  Specimen collection method  Answer:  Lab=Lab collect   01/03/21 0912   01/04/21 0037  Basic metabolic panel  Daily,   R     Question:  Specimen collection method  Answer:  Lab=Lab collect   01/03/21 0912            Signed, Terrilee Croak, MD [facility] 01/04/2021

## 2021-01-04 NOTE — TOC Benefit Eligibility Note (Signed)
Patient Advocate Encounter   Received notification that prior authorization for Vancomycin 125 mg is required.   PA submitted on 01/04/2021 Reference number:  [id number] Status is pending       Lyndel Safe, [place] Patient Advocate Specialist [facility]icrobial Stewardship Team Direct Number: [phone]  Fax: [phone]

## 2021-01-05 DIAGNOSIS — A0472 Enterocolitis due to Clostridium difficile, not specified as recurrent: Secondary | ICD-10-CM | POA: Diagnosis not present

## 2021-01-05 DIAGNOSIS — E440 Moderate protein-calorie malnutrition: Secondary | ICD-10-CM | POA: Insufficient documentation

## 2021-01-05 LAB — CBC WITH DIFFERENTIAL/PLATELET
Abs Immature Granulocytes: 0.02 10*3/uL (ref 0.00–0.07)
Basophils Absolute: 0 10*3/uL (ref 0.0–0.1)
Basophils Relative: 1 %
Eosinophils Absolute: 0.2 10*3/uL (ref 0.0–0.5)
Eosinophils Relative: 6 %
HCT: 31.2 % — ABNORMAL LOW (ref 39.0–52.0)
Hemoglobin: 10.2 g/dL — ABNORMAL LOW (ref 13.0–17.0)
Immature Granulocytes: 1 %
Lymphocytes Relative: 14 %
Lymphs Abs: 0.6 10*3/uL — ABNORMAL LOW (ref 0.7–4.0)
MCH: 25.5 pg — ABNORMAL LOW (ref 26.0–34.0)
MCHC: 32.7 g/dL (ref 30.0–36.0)
MCV: 78 fL — ABNORMAL LOW (ref 80.0–100.0)
Monocytes Absolute: 0.6 10*3/uL (ref 0.1–1.0)
Monocytes Relative: 14 %
Neutro Abs: 2.9 10*3/uL (ref 1.7–7.7)
Neutrophils Relative %: 64 %
Platelets: 115 10*3/uL — ABNORMAL LOW (ref 150–400)
RBC: 4 MIL/uL — ABNORMAL LOW (ref 4.22–5.81)
RDW: 15.3 % (ref 11.5–15.5)
WBC: 4.4 10*3/uL (ref 4.0–10.5)
nRBC: 0 % (ref 0.0–0.2)

## 2021-01-05 LAB — BASIC METABOLIC PANEL
Anion gap: 9 (ref 5–15)
BUN: 67 mg/dL — ABNORMAL HIGH (ref 8–23)
CO2: 20 mmol/L — ABNORMAL LOW (ref 22–32)
Calcium: 8.9 mg/dL (ref 8.9–10.3)
Chloride: 106 mmol/L (ref 98–111)
Creatinine, Ser: 2.43 mg/dL — ABNORMAL HIGH (ref 0.61–1.24)
GFR, Estimated: 28 mL/min — ABNORMAL LOW (ref >60)
Glucose, Bld: 224 mg/dL — ABNORMAL HIGH (ref 70–99)
Potassium: 4.2 mmol/L (ref 3.5–5.1)
Sodium: 135 mmol/L (ref 135–145)

## 2021-01-05 LAB — GLUCOSE, CAPILLARY
Glucose-Capillary: 204 mg/dL — ABNORMAL HIGH (ref 70–99)
Glucose-Capillary: 158 mg/dL — ABNORMAL HIGH (ref 70–99)
Glucose-Capillary: 169 mg/dL — ABNORMAL HIGH (ref 70–99)
Glucose-Capillary: 164 mg/dL — ABNORMAL HIGH (ref 70–99)

## 2021-01-05 MED ORDER — INSULIN GLARGINE-YFGN 100 UNIT/ML ~~LOC~~ SOLN
10.0000 [IU] | Freq: Every day | SUBCUTANEOUS | Status: DC
  Administered 2021-01-06 (×2): 10 [IU] via SUBCUTANEOUS
  Filled 2021-01-05 (×4): qty 0.1

## 2021-01-05 MED ORDER — LOPERAMIDE HCL 2 MG PO CAPS
2.0000 mg | ORAL_CAPSULE | Freq: Three times a day (TID) | ORAL | Status: DC | PRN
  Administered 2021-01-06 – 2021-01-07 (×4): 2 mg via ORAL
  Filled 2021-01-05 (×4): qty 1

## 2021-01-05 NOTE — Progress Notes (Signed)
Pharmacy - Antimicrobial Stewardship  Patient on fidaxomicin for C. Difficile with insurance copay of ~$1214.  Oral vancomycin required prior auth, so PA performed by our team's patient advocate and copay $95.  Discussed with patient this cost and he stated he can afford.  We discussed differences between fidaxomicin and PO vancomycin and with this being first infection, feel that PO vancomycin is totally reasonable.  Explained that it will be 4x/day vs 2x/day.   I called [facility] in [place] twice to confirm they have PO vancomycin in stock.   Doreene Eland, PharmD, BCPS.   Work Cell: [phone] 01/05/2021 11:00 AM

## 2021-01-05 NOTE — Care Management Important Message (Signed)
Important Message  Patient Details  Name: Christopher Davis MRN: [id number] Date of Birth: 1950/11/27   Medicare Important Message Given:  N/A - LOS <3 / Initial given by admissions  Initial Medicare IM reviewed with patient by phone by Thornton Dales, Patient Access Associate on 01/04/2021 at 8:53am.    Dannette Barbara 01/05/2021, 8:31 AM

## 2021-01-05 NOTE — Plan of Care (Signed)
  Problem: Education: Goal: Knowledge of risk factors and measures for prevention of condition will improve Outcome: Progressing   Problem: Coping: Goal: Psychosocial and spiritual needs will be supported Outcome: Progressing   Problem: Respiratory: Goal: Will maintain a patent airway Outcome: Progressing Goal: Complications related to the disease process, condition or treatment will be avoided or minimized Outcome: Progressing   

## 2021-01-05 NOTE — Progress Notes (Signed)
PROGRESS NOTE  Christopher Davis  DOB: [birth year]  PCP: Christopher Alexanders, MD TDV:[id number]  DOA: 01/02/2021  LOS: 3 days  Hospital Day: 4  Chief complaint: Diarrhea   Brief narrative: Christopher Davis is a 70 y.o. 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 Patient had osteomyelitis of the left foot for which he underwent amputation of fourth and fifth toe on 10/28/2020.  He was treated with broad-spectrum antibiotic at that time.  The wound has been healing well but patient has developed diarrhea which has been going on for the last 4 weeks with several bouts of watery bowel movements every day.  In the ED, patient had a temperature of 98.9, heart rate elevated to 120, respiratory rate elevated to 22. WBC count elevated to 16.4, lactic acid 1.7, sodium 134, potassium 5.7, BUN/creatinine elevated to 87/3.17 Chest x-ray negative for any infiltration. X-ray of the left foot showed anterolateral soft tissue ulceration without any bony abnormality. Stool assay found C. difficile test positive for antigen and toxin, negative GI pathogen panel CT abdomen and pelvis showed diffuse colonic wall thickening with mild pericolonic inflammatory soft tissue stranding concerning for colitis without evidence of pneumatosis or bowel perforation. Admitted to hospitalist service See below for details  Subjective: Patient was seen and examined this morning.  Lying down in bed.  Not in distress.  He states diarrhea frequency has gotten down but he still has watery stool.  Does not feel comfortable going home today.  Assessment/Plan: Sepsis due to C. difficile colitis -Presented with 4 weeks of diarrhea in  the setting of recent use of antibiotics for osteomyelitis -C. difficile positive in stool -Met sepsis criteria on admission with leukocytosis, tachycardia, tachypnea -Currently patient is improving on Dificid.  Diarrhea frequency has improved but he still continues to have liquidy stool.  I will start him on low-dose Imodium today. -Off IV hydration at this time.  Encourage oral hydration. -Continue to monitor diarrhea loss of fluid and electrolytes -Pharmacy checked prior authorization for Dificid for discharge.  It is $1200 compared to $95 for vancomycin.  So we will plan to switch him from Dificid to vancomycin at discharge. Recent Labs  Lab 01/02/21 0409 01/04/21 0425 01/05/21 0443  WBC 16.4* 6.2 4.4  LATICACIDVEN 1.7  --   --    Type 2 diabetes mellitus -A1c 7.7 on 12/28/2020 at [facility] meds include Lantus 10 units at bedtime. -Currently on 5 units at bedtime with sliding scale insulin and Accu-Cheks -Blood sugar level elevated to over 200 mostly.  Increase Lantus to 10 units for tonight.  Patient wants to continue regular diet for now. Recent Labs  Lab 01/04/21 1152 01/04/21 1605 01/04/21 2031 01/05/21 0832 01/05/21 1209  GLUCAP 111* 181* 273* 204* 158*   Stage IV CKD -Baseline creatinine 2.9 on 7/28.  Slightly elevated to 3.17 this admission, improved back to baseline.  Encourage oral hydration. Recent Labs    01/02/21 0409 01/03/21 0425 01/04/21 0425 01/05/21 0443  BUN 87* 77* 70* 67*  CREATININE 3.17* 2.84* 2.67* 2.43*   Chronic systolic CHF -Echo from November 2020 with EF 45 to 50% -CHF remains compensated at this time.   -Not on any diuretics at home.  Atrial fibrillation  History of CVA/hyperlipidemia History of DVT -Continue metoprolol and Eliquis.  Not on statin  Recent COVID-19 infection -Tested positive on 7/28.  Asymptomatic. -Continue to monitor respiratory status  Recent diabetic foot infection of left foot Stage II pressure ulcer on the  left heel -s/p of amputation of fourth and fifth toe of left foot. Seems to be healing well. -Wound care consult appreciated. -Follows up with [facility] vascular surgery   Bladder cancer and hydroureter on right: CT scan showed right-sided pelviectasis and mild right hydroureter to the level of the urinary bladder. 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 Patient is following up in [facility] urology.  Continue outpatient follow-up plan.    Mobility: PT eval ordered Code Status:   Code Status: Full Code  Nutritional status: Body mass index is 24.78 kg/m. Nutrition Problem: Moderate Malnutrition Etiology: chronic illness (bladder cancer) Signs/Symptoms: moderate fat depletion, moderate muscle depletion Diet:  Diet Order             Diet regular Room service appropriate? Yes; Fluid consistency: Thin  Diet effective now                  DVT prophylaxis:  apixaban (ELIQUIS) tablet 5 mg   Antimicrobials: Fidaxomicin Fluid: Not on IV fluid Consultants: None Family Communication: None at bedside  Status is: Inpatient  Remains inpatient appropriate because: C. difficile colitis  Dispo: The patient is from: Home              Anticipated d/c is to: Home health PT              Patient currently is not medically stable to d/c.   Difficult to place patient No     Infusions:   famotidine (PEPCID) IV 20 mg (01/04/21 2202)    Scheduled Meds:  apixaban  5 mg Oral BID   vitamin C  250 mg Oral BID   feeding supplement  237 mL Oral TID BM   fidaxomicin  200 mg Oral BID   insulin aspart  0-5 Units Subcutaneous QHS   insulin aspart  0-9 Units Subcutaneous TID WC   insulin glargine-yfgn  10 Units Subcutaneous QHS   loperamide  2 mg Oral Q8H   metoprolol succinate  200 mg Oral Daily   multivitamin with minerals  1 tablet Oral Daily    Antimicrobials: Anti-infectives (From admission, onward)    Start     Dose/Rate Route Frequency Ordered Stop    01/02/21 0715  fidaxomicin (DIFICID) tablet 200 mg        200 mg Oral 2 times daily 01/02/21 0655 01/12/21 0959       PRN meds: acetaminophen, albuterol, dextromethorphan-guaiFENesin, dextrose, ondansetron (ZOFRAN) IV   Objective: Vitals:   01/05/21 0900 01/05/21 1209  BP:  (!) 152/78  Pulse:  85  Resp: 12 18  Temp:  97.6 F (36.4 C)  SpO2:  100%    Intake/Output Summary (Last 24 hours) at 01/05/2021 1437 Last data filed at 01/05/2021 1914 Gross per 24 hour  Intake 1708.28 ml  Output 250 ml  Net 1458.28 ml   Filed Weights   01/03/21 0329 01/04/21 0500 01/05/21 0600  Weight: 75 kg 76 kg 76.1 kg   Weight change: 0.1 kg Body mass index is 24.78 kg/m.   Physical Exam: General exam: Pleasant, elderly Caucasian male.  Not in distress Skin: No rashes, lesions or ulcers. HEENT: Atraumatic, normocephalic, no obvious bleeding Lungs: Clear to  auscultation bilaterally CVS: Regular rate and rhythm, no murmur GI/Abd soft, nontender, nondistended, bowel sound present CNS: Alert, awake, oriented to place and person Psychiatry: Mood appropriate Extremities: No pedal edema, no calf tenderness  Data Review: I have personally reviewed the laboratory data and studies available.  Recent Labs  Lab 01/02/21 0409 01/04/21 0425 01/05/21 0443  WBC 16.4* 6.2 4.4  NEUTROABS 15.4* 4.6 2.9  HGB 12.1* 9.8* 10.2*  HCT 36.5* 30.2* 31.2*  MCV 78.2* 80.5 78.0*  PLT 147* 101* 115*   Recent Labs  Lab 01/02/21 0409 01/03/21 0425 01/04/21 0425 01/05/21 0443  NA 134* 132* 133* 135  K 5.7* 4.2 3.8 4.2  CL 100 105 102 106  CO2 22 19* 21* 20*  GLUCOSE 207* 123* 99 224*  BUN 87* 77* 70* 67*  CREATININE 3.17* 2.84* 2.67* 2.43*  CALCIUM 9.0 8.1* 7.9* 8.9  MG 1.7  --  1.7  --   PHOS  --   --  3.6  --     F/u labs ordered Unresulted Labs (From admission, onward)     Start     Ordered   01/04/21 0500  CBC with Differential/Platelet  Daily,   R     Question:  Specimen collection method   Answer:  Lab=Lab collect   01/03/21 0912   01/04/21 1062  Basic metabolic panel  Daily,   R     Question:  Specimen collection method  Answer:  Lab=Lab collect   01/03/21 0912            Signed, Terrilee Croak, MD [facility] 01/05/2021

## 2021-01-06 DIAGNOSIS — I5022 Chronic systolic (congestive) heart failure: Secondary | ICD-10-CM

## 2021-01-06 DIAGNOSIS — A0472 Enterocolitis due to Clostridium difficile, not specified as recurrent: Secondary | ICD-10-CM | POA: Diagnosis not present

## 2021-01-06 DIAGNOSIS — N179 Acute kidney failure, unspecified: Secondary | ICD-10-CM | POA: Diagnosis not present

## 2021-01-06 DIAGNOSIS — I4811 Longstanding persistent atrial fibrillation: Secondary | ICD-10-CM

## 2021-01-06 LAB — GLUCOSE, CAPILLARY
Glucose-Capillary: 196 mg/dL — ABNORMAL HIGH (ref 70–99)
Glucose-Capillary: 237 mg/dL — ABNORMAL HIGH (ref 70–99)
Glucose-Capillary: 290 mg/dL — ABNORMAL HIGH (ref 70–99)
Glucose-Capillary: 293 mg/dL — ABNORMAL HIGH (ref 70–99)

## 2021-01-06 LAB — CBC WITH DIFFERENTIAL/PLATELET
Abs Immature Granulocytes: 0.01 10*3/uL (ref 0.00–0.07)
Basophils Absolute: 0 10*3/uL (ref 0.0–0.1)
Basophils Relative: 1 %
Eosinophils Absolute: 0.3 10*3/uL (ref 0.0–0.5)
Eosinophils Relative: 6 %
HCT: 28.2 % — ABNORMAL LOW (ref 39.0–52.0)
Hemoglobin: 9.1 g/dL — ABNORMAL LOW (ref 13.0–17.0)
Immature Granulocytes: 0 %
Lymphocytes Relative: 20 %
Lymphs Abs: 0.8 10*3/uL (ref 0.7–4.0)
MCH: 25.3 pg — ABNORMAL LOW (ref 26.0–34.0)
MCHC: 32.3 g/dL (ref 30.0–36.0)
MCV: 78.6 fL — ABNORMAL LOW (ref 80.0–100.0)
Monocytes Absolute: 0.7 10*3/uL (ref 0.1–1.0)
Monocytes Relative: 17 %
Neutro Abs: 2.3 10*3/uL (ref 1.7–7.7)
Neutrophils Relative %: 56 %
Platelets: 123 10*3/uL — ABNORMAL LOW (ref 150–400)
RBC: 3.59 MIL/uL — ABNORMAL LOW (ref 4.22–5.81)
RDW: 15.2 % (ref 11.5–15.5)
WBC: 4.2 10*3/uL (ref 4.0–10.5)
nRBC: 0 % (ref 0.0–0.2)

## 2021-01-06 LAB — BASIC METABOLIC PANEL
Anion gap: 4 — ABNORMAL LOW (ref 5–15)
BUN: 59 mg/dL — ABNORMAL HIGH (ref 8–23)
CO2: 21 mmol/L — ABNORMAL LOW (ref 22–32)
Calcium: 8.7 mg/dL — ABNORMAL LOW (ref 8.9–10.3)
Chloride: 109 mmol/L (ref 98–111)
Creatinine, Ser: 2.23 mg/dL — ABNORMAL HIGH (ref 0.61–1.24)
GFR, Estimated: 31 mL/min — ABNORMAL LOW (ref >60)
Glucose, Bld: 306 mg/dL — ABNORMAL HIGH (ref 70–99)
Potassium: 4.2 mmol/L (ref 3.5–5.1)
Sodium: 134 mmol/L — ABNORMAL LOW (ref 135–145)

## 2021-01-06 MED ORDER — FAMOTIDINE 20 MG PO TABS
20.0000 mg | ORAL_TABLET | Freq: Every day | ORAL | Status: DC
  Administered 2021-01-06: 20 mg via ORAL
  Filled 2021-01-06: qty 1

## 2021-01-06 NOTE — Progress Notes (Signed)
Pt alert and oriented x 4. Ambulates independently to bathroom. Pt continues with diarrhea but pt states that the diarrhea "is getting better". Pt has received 2 doses of PRN Imodium this shift. Left foot dressing changed.

## 2021-01-06 NOTE — Progress Notes (Signed)
Inpatient Diabetes Program Recommendations  AACE/ADA: New Consensus Statement on Inpatient Glycemic Control (2015)  Target Ranges:  Prepandial:   less than 140 mg/dL      Peak postprandial:   less than 180 mg/dL (1-2 hours)      Critically ill patients:  140 - 180 mg/dL   Results for DORELL, GATLIN (MRN [id number]) as of 01/06/2021 12:32  Ref. Range 01/06/2021 07:34 01/06/2021 11:27  Glucose-Capillary Latest Ref Range: 70 - 99 mg/dL 196 (H) 237 (H)    Home DM Meds: Lantus 10 units QHS  Current Orders: Semglee 10 units QHS  Novolog 0-9 units TID ac/hs    Increased Semglee to 10 QHS last PM  CBGs remain elevated    MD- Please consider:  1. Increase Semlgee Insulin slightly to 12 units QHS  2. Start Novolog Meal Coverage: Novolog 3 units TID with meals  Hold if pt eats <50% of meal, Hold if pt NPO     --Will follow patient during hospitalization--  Wyn Quaker RN, MSN, CDE Diabetes Coordinator Inpatient Glycemic Control Team Team Pager: [phone] (8a-5p)

## 2021-01-06 NOTE — Progress Notes (Signed)
PROGRESS NOTE  Christopher Davis  DOB: Jul 23, 1950  PCP: Jill Alexanders, MD RSW:[id number]  DOA: 01/02/2021  LOS: 4 days  Hospital Day: 5  Chief complaint: Diarrhea   Brief narrative: Christopher Davis is a 70 y.o. male with PMH significant for DM2, HTN, HLD, stroke with mild left-sided weakness, DVT/A. fib on Eliquis, chronic systolic CHF, bladder cancer, CKD 4, PAD s/p angioplasty of the left lower extremity, osteomyelitis of left foot (s/p of amputation of 4th/5th toe on 10/28/2020-treated with broad-spectrum antibiotics), recent COVID-19 infection-presented to the ED on 7/30 with diarrhea-subsequently found to have C. difficile colitis.  12/28/2020   Subjective: Still having loose stools-but slowing down.  Assessment/Plan: Sepsis due to C. difficile colitis: Although continues to have loose stools-seems to be slowly improving-remains on Dificid-due to financial issues-we will plan to switch to oral vancomycin when closer to discharge.  Type 2 diabetes mellitus (A1c 7.7 on 7/25 at [facility]): CBGs relatively stable-continue Lantus 10 units daily-SSI-reassess on 8/4.  Recent Labs    01/06/21 0037 01/06/21 0734 01/06/21 1127  GLUCAP 290* 196* 237*     Stage IV CKD: Creatinine close to baseline-follow periodically  Chronic systolic CHF (EF 09-38% by echo November 2020): Euvolemic-does not require diuretics at this point.  Persistent atrial fibrillation: On metoprolol-anticoagulated with Eliquis.  History of CVA: Continues to have very mild left-sided weakness-on Eliquis.  Recent COVID-19 infection: Asymptomatic-tested positive on 7/28-requires isolation for 10 days while inpatient.  History of DVT: On Eliquis  Asymptomatic bacteriuria vs complicated UTI: Urine cultures positive for staph schleferi and Klebsiella-UA was fairly benign-given ongoing C. difficile colitis-avoid antibiotics for now.  Will discuss with ID over the next few days.  Watch closely.  Recent diabetic foot  infection of left foot Stage II pressure ulcer on the left heel -s/p of amputation of fourth and fifth toe of left foot. Seems to be healing well. -Wound care consult appreciated. -Follows up with [facility] vascular surgery   Bladder cancer and hydroureter on right: CT scan showed right-sided pelviectasis and mild right hydroureter to the level of the urinary bladder. 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 Patient is following up in [facility] u[facility].  Continue outpatient follow-up plan.    Mobility: PT eval ordered Code Status:   Code Status: Full Code  Nutritional status: Body mass index is 24.84 kg/m. Nutrition Problem: Moderate Malnutrition Etiology: chronic illness (bladder cancer) Signs/Symptoms: moderate fat depletion, moderate muscle depletion Diet:  Diet Order             Diet regular Room service appropriate? Yes; Fluid consistency: Thin  Diet effective now                  DVT prophylaxis:  apixaban (ELIQUIS) tablet 5 mg   Antimicrobials: Fidaxomicin Fluid: Not on IV fluid Consultants: None Family Communication: None at bedside-we will reach out to family over the next few days.  Status is: Inpatient  Remains inpatient appropriate because: C. difficile colitis  Dispo: The patient is from: Home              Anticipated d/c is to: Home health PT              Patient currently is not medically stable to d/c.   Difficult to place patient No    Scheduled Meds:  apixaban  5 mg Oral BID   vitamin C  250 mg Oral BID   famotidine  20 mg Oral QHS  feeding supplement  237 mL Oral TID BM   fidaxomicin  200 mg Oral BID   insulin aspart  0-5 Units Subcutaneous QHS   insulin aspart  0-9 Units Subcutaneous TID WC   insulin glargine-yfgn  10 Units Subcutaneous QHS   metoprolol succinate  200 mg Oral Daily   multivitamin with minerals  1 tablet Oral Daily    Antimicrobials: Anti-infectives (From admission, onward)    Start      Dose/Rate Route Frequency Ordered Stop   01/02/21 0715  fidaxomicin (DIFICID) tablet 200 mg        200 mg Oral 2 times daily 01/02/21 0655 01/12/21 0959       PRN meds: acetaminophen, albuterol, dextromethorphan-guaiFENesin, dextrose, loperamide, ondansetron (ZOFRAN) IV   Objective: Vitals:   01/06/21 0734 01/06/21 1129  BP: (!) 144/79 137/82  Pulse: 82 93  Resp: 18 17  Temp: 97.6 F (36.4 C) 97.6 F (36.4 C)  SpO2: 100% 100%    Intake/Output Summary (Last 24 hours) at 01/06/2021 1319 Last data filed at 01/06/2021 1128 Gross per 24 hour  Intake --  Output 650 ml  Net -650 ml    Filed Weights   01/04/21 0500 01/05/21 0600 01/06/21 0600  Weight: 76 kg 76.1 kg 76.3 kg   Weight change: 0.2 kg Body mass index is 24.84 kg/m.   Physical Exam: Gen Exam:Alert awake-not in any distress HEENT:atraumatic, normocephalic Chest: B/L clear to auscultation anteriorly CVS:S1S2 regular Abdomen:soft non tender, non distended Extremities:no edema Neurology: Mild left-sided deficits Skin: no rash   Data Review: I have personally reviewed the laboratory data and studies available.  Recent Labs  Lab 01/02/21 0409 01/04/21 0425 01/05/21 0443 01/06/21 0406  WBC 16.4* 6.2 4.4 4.2  NEUTROABS 15.4* 4.6 2.9 2.3  HGB 12.1* 9.8* 10.2* 9.1*  HCT 36.5* 30.2* 31.2* 28.2*  MCV 78.2* 80.5 78.0* 78.6*  PLT 147* 101* 115* 123*    Recent Labs  Lab 01/02/21 0409 01/03/21 0425 01/04/21 0425 01/05/21 0443 01/06/21 0406  NA 134* 132* 133* 135 134*  K 5.7* 4.2 3.8 4.2 4.2  CL 100 105 102 106 109  CO2 22 19* 21* 20* 21*  GLUCOSE 207* 123* 99 224* 306*  BUN 87* 77* 70* 67* 59*  CREATININE 3.17* 2.84* 2.67* 2.43* 2.23*  CALCIUM 9.0 8.1* 7.9* 8.9 8.7*  MG 1.7  --  1.7  --   --   PHOS  --   --  3.6  --   --      F/u labs ordered Unresulted Labs (From admission, onward)    None       Signed, Oren Binet, MD [facility] 01/06/2021

## 2021-01-06 NOTE — TOC Transition Note (Signed)
Transition of Care [facility]) - CM/SW Discharge Note   Patient Details  Name: Christopher Davis MRN: [id number] Date of Birth: 1951-03-14  Transition of Care [facility]) CM/SW Contact:  Eileen Stanford, LCSW Phone Number: 01/06/2021, 10:29 AM   Clinical Narrative:   Pt is refusing HH. NO additional needs.     Final next level of care: Home/Self Care Barriers to Discharge: No Barriers Identified   Patient Goals and CMS Choice Patient states their goals for this hospitalization and ongoing recovery are:: to go home      Discharge Placement                       Discharge Plan and Services In-house Referral: NA   Post Acute Care Choice: NA                               Social Determinants of Health (SDOH) Interventions     Readmission Risk Interventions Readmission Risk Prevention Plan 01/04/2021  Transportation Screening Complete  PCP or Specialist Appt within 3-5 Days Complete  HRI or [place] Complete  Social Work Consult for [place] Planning/Counseling Complete  Palliative Care Screening Complete  Medication Review [employer]) Complete

## 2021-01-07 DIAGNOSIS — N179 Acute kidney failure, unspecified: Secondary | ICD-10-CM | POA: Diagnosis not present

## 2021-01-07 DIAGNOSIS — I5022 Chronic systolic (congestive) heart failure: Secondary | ICD-10-CM | POA: Diagnosis not present

## 2021-01-07 DIAGNOSIS — C679 Malignant neoplasm of bladder, unspecified: Secondary | ICD-10-CM | POA: Diagnosis not present

## 2021-01-07 DIAGNOSIS — A0472 Enterocolitis due to Clostridium difficile, not specified as recurrent: Secondary | ICD-10-CM | POA: Diagnosis not present

## 2021-01-07 LAB — CBC
HCT: 28.8 % — ABNORMAL LOW (ref 39.0–52.0)
Hemoglobin: 9.4 g/dL — ABNORMAL LOW (ref 13.0–17.0)
MCH: 26 pg (ref 26.0–34.0)
MCHC: 32.6 g/dL (ref 30.0–36.0)
MCV: 79.8 fL — ABNORMAL LOW (ref 80.0–100.0)
Platelets: 144 10*3/uL — ABNORMAL LOW (ref 150–400)
RBC: 3.61 MIL/uL — ABNORMAL LOW (ref 4.22–5.81)
RDW: 15.5 % (ref 11.5–15.5)
WBC: 4.8 10*3/uL (ref 4.0–10.5)
nRBC: 0 % (ref 0.0–0.2)

## 2021-01-07 LAB — CULTURE, BLOOD (ROUTINE X 2)
Culture: NO GROWTH
Culture: NO GROWTH
Special Requests: ADEQUATE
Special Requests: ADEQUATE

## 2021-01-07 LAB — COMPREHENSIVE METABOLIC PANEL
ALT: 29 U/L (ref 0–44)
AST: 22 U/L (ref 15–41)
Albumin: 2.8 g/dL — ABNORMAL LOW (ref 3.5–5.0)
Alkaline Phosphatase: 81 U/L (ref 38–126)
Anion gap: 6 (ref 5–15)
BUN: 49 mg/dL — ABNORMAL HIGH (ref 8–23)
CO2: 25 mmol/L (ref 22–32)
Calcium: 8.8 mg/dL — ABNORMAL LOW (ref 8.9–10.3)
Chloride: 108 mmol/L (ref 98–111)
Creatinine, Ser: 1.95 mg/dL — ABNORMAL HIGH (ref 0.61–1.24)
GFR, Estimated: 36 mL/min — ABNORMAL LOW (ref >60)
Glucose, Bld: 77 mg/dL (ref 70–99)
Potassium: 4 mmol/L (ref 3.5–5.1)
Sodium: 139 mmol/L (ref 135–145)
Total Bilirubin: 0.5 mg/dL (ref 0.3–1.2)
Total Protein: 6.7 g/dL (ref 6.5–8.1)

## 2021-01-07 LAB — GLUCOSE, CAPILLARY
Glucose-Capillary: 100 mg/dL — ABNORMAL HIGH (ref 70–99)
Glucose-Capillary: 219 mg/dL — ABNORMAL HIGH (ref 70–99)

## 2021-01-07 MED ORDER — VANCOMYCIN HCL 125 MG PO CAPS: 125.0000 mg | ORAL_CAPSULE | Freq: Four times a day (QID) | ORAL | 0 refills | Status: AC

## 2021-01-07 NOTE — Discharge Summary (Signed)
PATIENT DETAILS Name: Christopher Davis Age: 70 y.o. Sex: male Date of Birth: 20-Jul-1950 MRN: [id number]. Admitting Physician: Ivor Costa, MD [id number], Trixie Deis, MD  Admit Date: 01/02/2021 Discharge date: 01/07/2021  Recommendations for Outpatient Follow-up:  Follow up with PCP in 1-2 weeks Please obtain CMP/CBC in one week Minimize use of antimicrobial therapy as much as possible in the future. Please refer patient to urology-could have possible underlying bladder neoplasm Patient has asymptomatic bacteriuria-if in the future-patient needed cystoscopy/urologic procedures- will need antimicrobial therapy.  Admitted From:  Home  Disposition: [place]: No  Equipment/Devices: None  Discharge Condition: Stable  CODE STATUS: FULL CODE  Diet recommendation:  Diet Order             Diet - low sodium heart healthy           Diet regular Room service appropriate? Yes; Fluid consistency: Thin  Diet effective now                    Brief Summary: Christopher Davis is a 70 y.o. male with PMH significant for DM2, HTN, HLD, stroke with mild left-sided weakness, DVT/A. fib on Eliquis, chronic systolic CHF, bladder cancer, CKD 4, PAD s/p angioplasty of the left lower extremity, osteomyelitis of left foot (s/p of amputation of 4th/5th toe on 10/28/2020-treated with broad-spectrum antibiotics), recent COVID-19 infection-presented to the ED on 7/30 with diarrhea-subsequently found to have C. difficile colitis.    Brief Hospital Course: Sepsis due to C. difficile colitis: Sepsis physiology has resolved-diarrhea has essentially resolved by the day of admission-per patient he had a  small BM overnight-stool now formed.  Patient had antimicrobial therapy exposure for diabetic foot that he had 2 months prior to this hospital stay-likely the cause of his C. difficile colitis.  He was treated with Dificid-due to financial issues-he will be transition to oral vancomycin on discharge.   Please minimize use of antibiotics in the future.     Type 2 diabetes mellitus (A1c 7.7 on 7/25 at [facility]): CBGs relatively stable-continue Lantus 10 units daily-follow-up with PCP  Stage IV CKD: Creatinine slowly improving-please recheck electrolytes at PCPs office at follow-up   Chronic systolic CHF (EF 24-09% by echo November 2020): Euvolemic-does not require diuretics at this point.   Persistent atrial fibrillation: On metoprolol-anticoagulated with Eliquis.   History of CVA: Continues to have very mild left-sided weakness-on Eliquis.   Recent COVID-19 infection: Asymptomatic-tested positive on 7/28-requires isolation for 10 days while inpatient.   History of DVT: On Eliquis   Asymptomatic bacteriuria vs complicated UTI: Urine cultures positive for staph schleferi and Klebsiella-UA was fairly benign-given ongoing C. difficile colitis-antibiotics were avoided as this was felt to be colonization rather than a true infection.  Case briefly discussed with infectious disease MD-Dr. Steva Ready over the phone-recommends that since patient's sepsis physiology has resolved with treating C. difficile-urine culture results are probably a colonization rather than a true infection.  Given that he possibly could have underlying bladder cancer-she recommends that if patient needs urological procedures like a cystoscopy in the future-he will need antimicrobial therapy.     Recent diabetic foot infection of left foot Stage II pressure ulcer on the left heel -s/p of amputation of fourth and fifth toe of left foot. Seems to be healing well. -Wound care consult appreciated. -Follows up with [facility] vascular [facility]   Bladder cancer and hydroureter on right: CT scan showed right-sided pelviectasis and mild right hydroureter to the level of the  urinary bladder. 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 Patient is following up in [facility] urology.  Continue outpatient follow-up  plan.  I have encouraged patient on day of discharge to get a outpatient appointment with urology-palpation-this is currently in the works-and since he had a diabetic foot ulcer-he has not had a chance to set it up.    Mobility: PT eval ordered Code Status:   Code Status: Full Code  Nutritional status: Body mass index is 24.84 kg/m. Nutrition Problem: Moderate Malnutrition Etiology: chronic illness (bladder cancer) Signs/Symptoms: moderate fat depletion, moderate muscle depletion    Procedures None  Discharge Diagnoses:  Principal Problem:   C. difficile colitis Active Problems:   Sepsis ([place])   Atrial fibrillation ([place])   Bladder cancer ([place])   COVID-19 virus infection   Diabetic infection of left foot ([place])   History of CVA (cerebrovascular accident)   Hyperlipidemia   Personal history of DVT (deep vein thrombosis)   Hyperkalemia   Hydroureter on right   CKD (chronic kidney disease), stage IV (HCC)   Chronic systolic CHF (congestive heart failure) (HCC)   Malnutrition of moderate degree   Discharge Instructions:  Activity:  As tolerated   Discharge Instructions     Call MD for:  persistant dizziness or light-headedness   Complete by: As directed    Call MD for:  persistant nausea and vomiting   Complete by: As directed    Call MD for:  temperature >100.4   Complete by: As directed    Diet - low sodium heart healthy   Complete by: As directed    Discharge instructions   Complete by: As directed    Follow with Primary MD  Jill Alexanders, MD in 1-2 weeks  Please get a complete blood count and chemistry panel checked by your Primary MD at your next visit, and again as instructed by your Primary MD.  Get Medicines reviewed and adjusted: Please take all your medications with you for your next visit with your Primary MD  Laboratory/radiological data: Please request your Primary MD to go over all hospital tests and procedure/radiological results at the follow  up, please ask your Primary MD to get all Hospital records sent to his/her office.  In some cases, they will be blood work, cultures and biopsy results pending at the time of your discharge. Please request that your primary care M.D. follows up on these results.  Also Note the following: If you experience worsening of your admission symptoms, develop shortness of breath, life threatening emergency, suicidal or homicidal thoughts you must seek medical attention immediately by calling 911 or calling your MD immediately  if symptoms less severe.  You must read complete instructions/literature along with all the possible adverse reactions/side effects for all the Medicines you take and that have been prescribed to you. Take any new Medicines after you have completely understood and accpet all the possible adverse reactions/side effects.   Do not drive when taking Pain medications or sleeping medications (Benzodaizepines)  Do not take more than prescribed Pain, Sleep and Anxiety Medications. It is not advisable to combine anxiety,sleep and pain medications without talking with your primary care practitioner  Special Instructions: If you have smoked or chewed Tobacco  in the last 2 yrs please stop smoking, stop any regular Alcohol  and or any Recreational drug use.  Wear Seat belts while driving.  Please note: You were cared for by a hospitalist during your hospital stay. Once  you are discharged, your primary care physician will handle any further medical issues. Please note that NO REFILLS for any discharge medications will be authorized once you are discharged, as it is imperative that you return to your primary care physician (or establish a relationship with a primary care physician if you do not have one) for your post hospital discharge needs so that they can reassess your need for medications and monitor your lab values.   1.)  Please get a referral to a urologist from a primary care  practitioner-you may have bladder masses/malignancy-needs to be ruled out.   2.)  Since you have had C. difficile infection of your colon-you need to minimize antibiotics as much as possible.   Discharge wound care:   Complete by: As directed    Wound care to left foot surgical site and left heel (Stage 2) pressure injury (POA): Wash LE with soap and water, rinse wounds with NS, and pat dry. Cover surgical site and heel skin injuryt with size appropriate piece of xeroform gauze Kellie Simmering # 294). Top with dry gauze and secure with a few turns of Kerlix roll gauze/paper tape. Place foot (feet) into [employer].   Increase activity slowly   Complete by: As directed       Allergies as of 01/07/2021       Reactions   Rosuvastatin    Caused "body aches" per pt        Medication List     TAKE these medications    acetaminophen 650 MG CR tablet Commonly known as: TYLENOL Take 1,300 mg by mouth every 8 (eight) hours as needed.   apixaban 5 MG Tabs tablet Commonly known as: ELIQUIS Take 5 mg by mouth 2 (two) times daily.   insulin glargine 100 UNIT/ML Solostar Pen Commonly known as: LANTUS Inject 10 Units into the skin at bedtime.   metoprolol succinate 100 MG 24 hr tablet Commonly known as: TOPROL-XL Take 2 tablets by mouth daily.   vancomycin 125 MG capsule Commonly known as: VANCOCIN Take 1 capsule (125 mg total) by mouth 4 (four) times daily for 5 days.               Durable Medical Equipment  (From admission, onward)           Start     Ordered   01/04/21 1510  For home use only DME Hospital bed  Once       Question Answer Comment  Length of Need Lifetime   Bed type Semi-electric   Support Surface: Gel Overlay      01/04/21 1510              Discharge Care Instructions  (From admission, onward)           Start     Ordered   01/07/21 0000  Discharge wound care:       Comments: Wound care to left foot surgical site and left heel (Stage 2)  pressure injury (POA): Wash LE with soap and water, rinse wounds with NS, and pat dry. Cover surgical site and heel skin injuryt with size appropriate piece of xeroform gauze Kellie Simmering # 294). Top with dry gauze and secure with a few turns of Kerlix roll gauze/paper tape. Place foot (feet) into [employer].   01/07/21 1252            Follow-up Information     Jill Alexanders, MD. Schedule an appointment as soon as possible for a  visit in 1 week(s).   Specialty: Internal Medicine Contact information: [place] [address] [phone]                Allergies  Allergen Reactions   Rosuvastatin     Caused "body aches" per pt      Consultations: None   Other Procedures/Studies: CT ABDOMEN PELVIS WO CONTRAST  Result Date: 01/02/2021 CLINICAL DATA:  Renal failure.  Diarrhea.  Obstructive uropathy. EXAM: CT ABDOMEN AND PELVIS WITHOUT CONTRAST TECHNIQUE: Multidetector CT imaging of the abdomen and pelvis was performed following the standard protocol without IV contrast. COMPARISON:  None. FINDINGS: Lower chest: No acute abnormality. Hepatobiliary: No focal liver abnormality is seen. No gallstones, gallbladder wall thickening, or biliary dilatation. Pancreas: Unremarkable. No pancreatic ductal dilatation or surrounding inflammatory changes. Spleen: Normal in size without focal abnormality. Adrenals/Urinary Tract: Normal adrenal glands. No kidney mass or stones identified bilaterally. Right pelviectasis and mild right hydroureter to the level of the urinary bladder identified. Posterior dome of bladder wall lesion measures 1.8 cm, image 69/6. A second lesion is noted along the posterior base measuring 5.9 cm,, image 58/6. This second lesion appears to obstruct the right UVJ, image 76/2. Both of these are suspicious for urothelial neoplasms. Stomach/Bowel: Stomach appears normal. The appendix is not confidently identified. Within the limitations of unenhanced  technique there is diffuse colonic wall thickening with mild pericolonic inflammatory soft tissue stranding concerning for colitis. No signs of pneumatosis or bowel perforation. No evidence for bowel obstruction. Moderate stool burden identified within the sigmoid colon and rectum. Vascular/Lymphatic: Aortic atherosclerosis. No aneurysm. No abdominopelvic adenopathy. Reproductive: Prostate is unremarkable. Other: No abdominal wall hernia or abnormality. No abdominopelvic ascites. Musculoskeletal: No acute or significant osseous findings. Marked degenerative disc disease identified within the lumbar spine. This is most advanced at L4-5 and L5-S1. IMPRESSION: 1. Examination is positive for diffuse colonic wall thickening with mild pericolonic inflammatory soft tissue stranding concerning for colitis. No signs of pneumatosis or bowel perforation. 2. Right-sided pelviectasis and mild right hydroureter to the level of the urinary bladder. 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 3. Lumbar spondylosis. 4. Aortic atherosclerosis. Aortic Atherosclerosis (ICD10-I70.0). Electronically Signed   By: Kerby Moors M.D.   On: 01/02/2021 06:37   DG Chest Portable 1 View  Result Date: 01/02/2021 CLINICAL DATA:  70 year old male with diarrhea, fever, left foot wound. Diaphoresis. EXAM: PORTABLE CHEST 1 VIEW COMPARISON:  None. FINDINGS: Portable AP upright view at 0430 hours. Lung volumes and mediastinal contours are within normal limits. Visualized tracheal air column is within normal limits. Calcified aortic atherosclerosis. Allowing for portable technique the lungs are clear. Somewhat symmetric skin fold artifacts suspected over both upper lobes. No pneumothorax or pleural effusion. No acute osseous abnormality identified. IMPRESSION: 1.  No acute cardiopulmonary abnormality. 2.  Aortic Atherosclerosis (ICD10-I70.0). Electronically Signed   By: Genevie Ann M.D.   On: 01/02/2021 04:57    DG Foot Complete Left  Result Date: 01/02/2021 CLINICAL DATA:  Sepsis, chronic left foot wound EXAM: LEFT FOOT - COMPLETE 3+ VIEW COMPARISON:  None. FINDINGS: Transmetatarsal amputation of the a fourth and fifth digits has been performed. A a probable deep ulcer is seen within the soft tissues of the residual left forefoot laterally anterior to the a residual fourth metatarsal. There is no subjacent osseous erosion to suggest osteomyelitis. No acute fracture or dislocation. Residual joint spaces are preserved. Advanced vascular calcifications are seen within the left foot.  Large plantar calcaneal spur. 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 Anterolateral soft tissue ulceration. No superimposed osseous erosion. Electronically Signed   By: Fidela Salisbury MD   On: 01/02/2021 04:59     TODAY-DAY OF DISCHARGE:  Subjective:   Christopher Davis today has no headache,no chest abdominal pain,no new weakness tingling or numbness, feels much better wants to go home today.   Objective:   Blood pressure 130/81, pulse 79, temperature 98.1 F (36.7 C), temperature source Oral, resp. rate 18, height 5\' 9"  (1.753 m), weight 76.3 kg, SpO2 100 %.  Intake/Output Summary (Last 24 hours) at 01/07/2021 1252 Last data filed at 01/06/2021 2100 Gross per 24 hour  Intake 480 ml  Output 550 ml  Net -70 ml   Filed Weights   01/04/21 0500 01/05/21 0600 01/06/21 0600  Weight: 76 kg 76.1 kg 76.3 kg    Exam: Awake Alert, Oriented *3, No new F.N deficits, Normal affect [place].AT,PERRAL Supple Neck,No JVD, No cervical lymphadenopathy appriciated.  Symmetrical Chest wall movement, Good air movement bilaterally, CTAB RRR,No Gallops,Rubs or new Murmurs, No Parasternal Heave +ve B.Sounds, Abd Soft, Non tender, No organomegaly appriciated, No rebound -guarding or rigidity. No Cyanosis, Clubbing or edema, No new Rash or bruise   PERTINENT RADIOLOGIC STUDIES: No results found.   PERTINENT  LAB RESULTS: CBC: Recent Labs    01/06/21 0406 01/07/21 0429  WBC 4.2 4.8  HGB 9.1* 9.4*  HCT 28.2* 28.8*  PLT 123* 144*   CMET CMP     Component Value Date/Time   NA 139 01/07/2021 0429   K 4.0 01/07/2021 0429   CL 108 01/07/2021 0429   CO2 25 01/07/2021 0429   GLUCOSE 77 01/07/2021 0429   BUN 49 (H) 01/07/2021 0429   CREATININE 1.95 (H) 01/07/2021 0429   CALCIUM 8.8 (L) 01/07/2021 0429   PROT 6.7 01/07/2021 0429   ALBUMIN 2.8 (L) 01/07/2021 0429   AST 22 01/07/2021 0429   ALT 29 01/07/2021 0429   ALKPHOS 81 01/07/2021 0429   BILITOT 0.5 01/07/2021 0429   GFRNONAA 36 (L) 01/07/2021 0429    GFR Estimated Creatinine Clearance: 35.2 mL/min (A) (by C-G formula based on SCr of 1.95 mg/dL (H)). No results for input(s): LIPASE, AMYLASE in the last 72 hours. No results for input(s): CKTOTAL, CKMB, CKMBINDEX, TROPONINI in the last 72 hours. Invalid input(s): POCBNP No results for input(s): DDIMER in the last 72 hours. No results for input(s): HGBA1C in the last 72 hours. No results for input(s): CHOL, HDL, LDLCALC, TRIG, CHOLHDL, LDLDIRECT in the last 72 hours. No results for input(s): TSH, T4TOTAL, T3FREE, THYROIDAB in the last 72 hours.  Invalid input(s): FREET3 No results for input(s): VITAMINB12, FOLATE, FERRITIN, TIBC, IRON, RETICCTPCT in the last 72 hours. Coags: No results for input(s): INR in the last 72 hours.  Invalid input(s): PT Microbiology: Recent Results (from the past 240 hour(s))  Culture, blood (Routine x 2)     Status: None   Collection Time: 01/02/21  4:09 AM   Specimen: BLOOD  Result Value Ref Range Status   Specimen Description BLOOD LEFT ANTECUBITAL  Final   Special Requests   Final    BOTTLES DRAWN AEROBIC AND ANAEROBIC Blood Culture adequate volume   Culture   Final    NO GROWTH 5 DAYS Performed at [facility], [address] [place], [place] [address]    Report Status 01/07/2021 FINAL  Final  Culture, blood (Routine x 2)  Status: None   Collection Time: 01/02/21  4:10 AM   Specimen: BLOOD  Result Value Ref Range Status   Specimen Description BLOOD RIGHT ANTECUBITAL  Final   Special Requests   Final    BOTTLES DRAWN AEROBIC AND ANAEROBIC Blood Culture adequate volume   Culture   Final    NO GROWTH 5 DAYS Performed at [facility], [address], [place], [place] [address]    Report Status 01/07/2021 FINAL  Final  C Difficile Quick Screen w PCR reflex     Status: Abnormal   Collection Time: 01/02/21  4:51 AM   Specimen: STOOL  Result Value Ref Range Status   C Diff antigen POSITIVE (A) NEGATIVE Final   C Diff toxin POSITIVE (A) NEGATIVE Final   C Diff interpretation Toxin producing C. difficile detected.  Final    Comment: CRITICAL RESULT CALLED TO, READ BACK BY AND VERIFIED WITH: [facility], Cooper SCHIFSELBEIN @ 2353 01/02/21 LFD Performed at [facility], [address] [place], [place] [address]   Gastrointestinal Panel by PCR , Stool     Status: None   Collection Time: 01/02/21  4:51 AM   Specimen: STOOL  Result Value Ref Range Status   Campylobacter species NOT DETECTED NOT DETECTED Final   Plesimonas shigelloides NOT DETECTED NOT DETECTED Final   Salmonella species NOT DETECTED NOT DETECTED Final   Yersinia enterocolitica NOT DETECTED NOT DETECTED Final   Vibrio species NOT DETECTED NOT DETECTED Final   Vibrio cholerae NOT DETECTED NOT DETECTED Final   Enteroaggregative E coli (EAEC) NOT DETECTED NOT DETECTED Final   Enteropathogenic E coli (EPEC) NOT DETECTED NOT DETECTED Final   Enterotoxigenic E coli (ETEC) NOT DETECTED NOT DETECTED Final   Shiga like toxin producing E coli (STEC) NOT DETECTED NOT DETECTED Final   Shigella/Enteroinvasive E coli (EIEC) NOT DETECTED NOT DETECTED Final   Cryptosporidium NOT DETECTED NOT DETECTED Final   Cyclospora cayetanensis NOT DETECTED NOT DETECTED Final   Entamoeba histolytica NOT DETECTED NOT DETECTED Final   Giardia lamblia NOT DETECTED  NOT DETECTED Final   Adenovirus F40/41 NOT DETECTED NOT DETECTED Final   Astrovirus NOT DETECTED NOT DETECTED Final   Norovirus GI/GII NOT DETECTED NOT DETECTED Final   Rotavirus A NOT DETECTED NOT DETECTED Final   Sapovirus (I, II, IV, and V) NOT DETECTED NOT DETECTED Final    Comment: Performed at [facility], [address] [place], [place] [address]  Urine Culture     Status: Abnormal   Collection Time: 01/02/21  6:22 AM   Specimen: In/Out Cath Urine  Result Value Ref Range Status   Specimen Description   Final    IN/OUT CATH URINE Performed at [facility], [address] [place], [place] [address]    Special Requests   Final    NONE Performed at [facility], [address] [place], [place] [address]    Culture (A)  Final    60,000 COLONIES/mL STAPHYLOCOCCUS SCHLEIFERI 60,000 COLONIES/mL KLEBSIELLA ORNITHINOLYTICA    Report Status 01/04/2021 FINAL  Final   Organism ID, Bacteria STAPHYLOCOCCUS SCHLEIFERI (A)  Final   Organism ID, Bacteria KLEBSIELLA ORNITHINOLYTICA (A)  Final      Susceptibility   Staphylococcus schleiferi - MIC*    CIPROFLOXACIN <=0.5 SENSITIVE Sensitive     GENTAMICIN <=0.5 SENSITIVE Sensitive     NITROFURANTOIN <=16 SENSITIVE Sensitive     OXACILLIN <=0.25 SENSITIVE Sensitive     TETRACYCLINE <=1 SENSITIVE Sensitive  VANCOMYCIN 1 SENSITIVE Sensitive     TRIMETH/SULFA <=10 SENSITIVE Sensitive     CLINDAMYCIN <=0.25 SENSITIVE Sensitive     RIFAMPIN <=0.5 SENSITIVE Sensitive     Inducible Clindamycin NEGATIVE Sensitive     * 60,000 COLONIES/mL STAPHYLOCOCCUS SCHLEIFERI   Klebsiella ornithinolytica - MIC*    AMPICILLIN 8 SENSITIVE Sensitive     CEFAZOLIN >=64 RESISTANT Resistant     CEFEPIME <=0.12 SENSITIVE Sensitive     CEFTRIAXONE <=0.25 SENSITIVE Sensitive     CIPROFLOXACIN <=0.25 SENSITIVE Sensitive     GENTAMICIN <=1 SENSITIVE Sensitive     IMIPENEM <=0.25 SENSITIVE Sensitive     NITROFURANTOIN 64  INTERMEDIATE Intermediate     TRIMETH/SULFA <=20 SENSITIVE Sensitive     AMPICILLIN/SULBACTAM <=2 SENSITIVE Sensitive     PIP/TAZO <=4 SENSITIVE Sensitive     * 60,000 COLONIES/mL KLEBSIELLA ORNITHINOLYTICA    FURTHER DISCHARGE INSTRUCTIONS:  Get Medicines reviewed and adjusted: Please take all your medications with you for your next visit with your Primary MD  Laboratory/radiological data: Please request your Primary MD to go over all hospital tests and procedure/radiological results at the follow up, please ask your Primary MD to get all Hospital records sent to his/her office.  In some cases, they will be blood work, cultures and biopsy results pending at the time of your discharge. Please request that your primary care M.D. goes through all the records of your hospital data and follows up on these results.  Also Note the following: If you experience worsening of your admission symptoms, develop shortness of breath, life threatening emergency, suicidal or homicidal thoughts you must seek medical attention immediately by calling 911 or calling your MD immediately  if symptoms less severe.  You must read complete instructions/literature along with all the possible adverse reactions/side effects for all the Medicines you take and that have been prescribed to you. Take any new Medicines after you have completely understood and accpet all the possible adverse reactions/side effects.   Do not drive when taking Pain medications or sleeping medications (Benzodaizepines)  Do not take more than prescribed Pain, Sleep and Anxiety Medications. It is not advisable to combine anxiety,sleep and pain medications without talking with your primary care practitioner  Special Instructions: If you have smoked or chewed Tobacco  in the last 2 yrs please stop smoking, stop any regular Alcohol  and or any Recreational drug use.  Wear Seat belts while driving.  Please note: You were cared for by a  hospitalist during your hospital stay. Once you are discharged, your primary care physician will handle any further medical issues. Please note that NO REFILLS for any discharge medications will be authorized once you are discharged, as it is imperative that you return to your primary care physician (or establish a relationship with a primary care physician if you do not have one) for your post hospital discharge needs so that they can reassess your need for medications and monitor your lab values.  Total Time spent coordinating discharge including counseling, education and face to face time equals 35 minutes.  SignedOren Binet 01/07/2021 12:52 PM

## 2021-01-07 NOTE — Progress Notes (Signed)
Reviewed DC instructions with pt at bedside. All questions answered at this time. Peripheral IV's removed. Waiting on ride.

## 2023-03-05 IMAGING — DX DG FOOT COMPLETE 3+V*L*
3 series · 3 of 3 positions shown · non-contrast
Comparison: None.

CLINICAL DATA: Sepsis, chronic left foot wound

EXAM:
LEFT FOOT - COMPLETE 3+ VIEW

[foot ap]
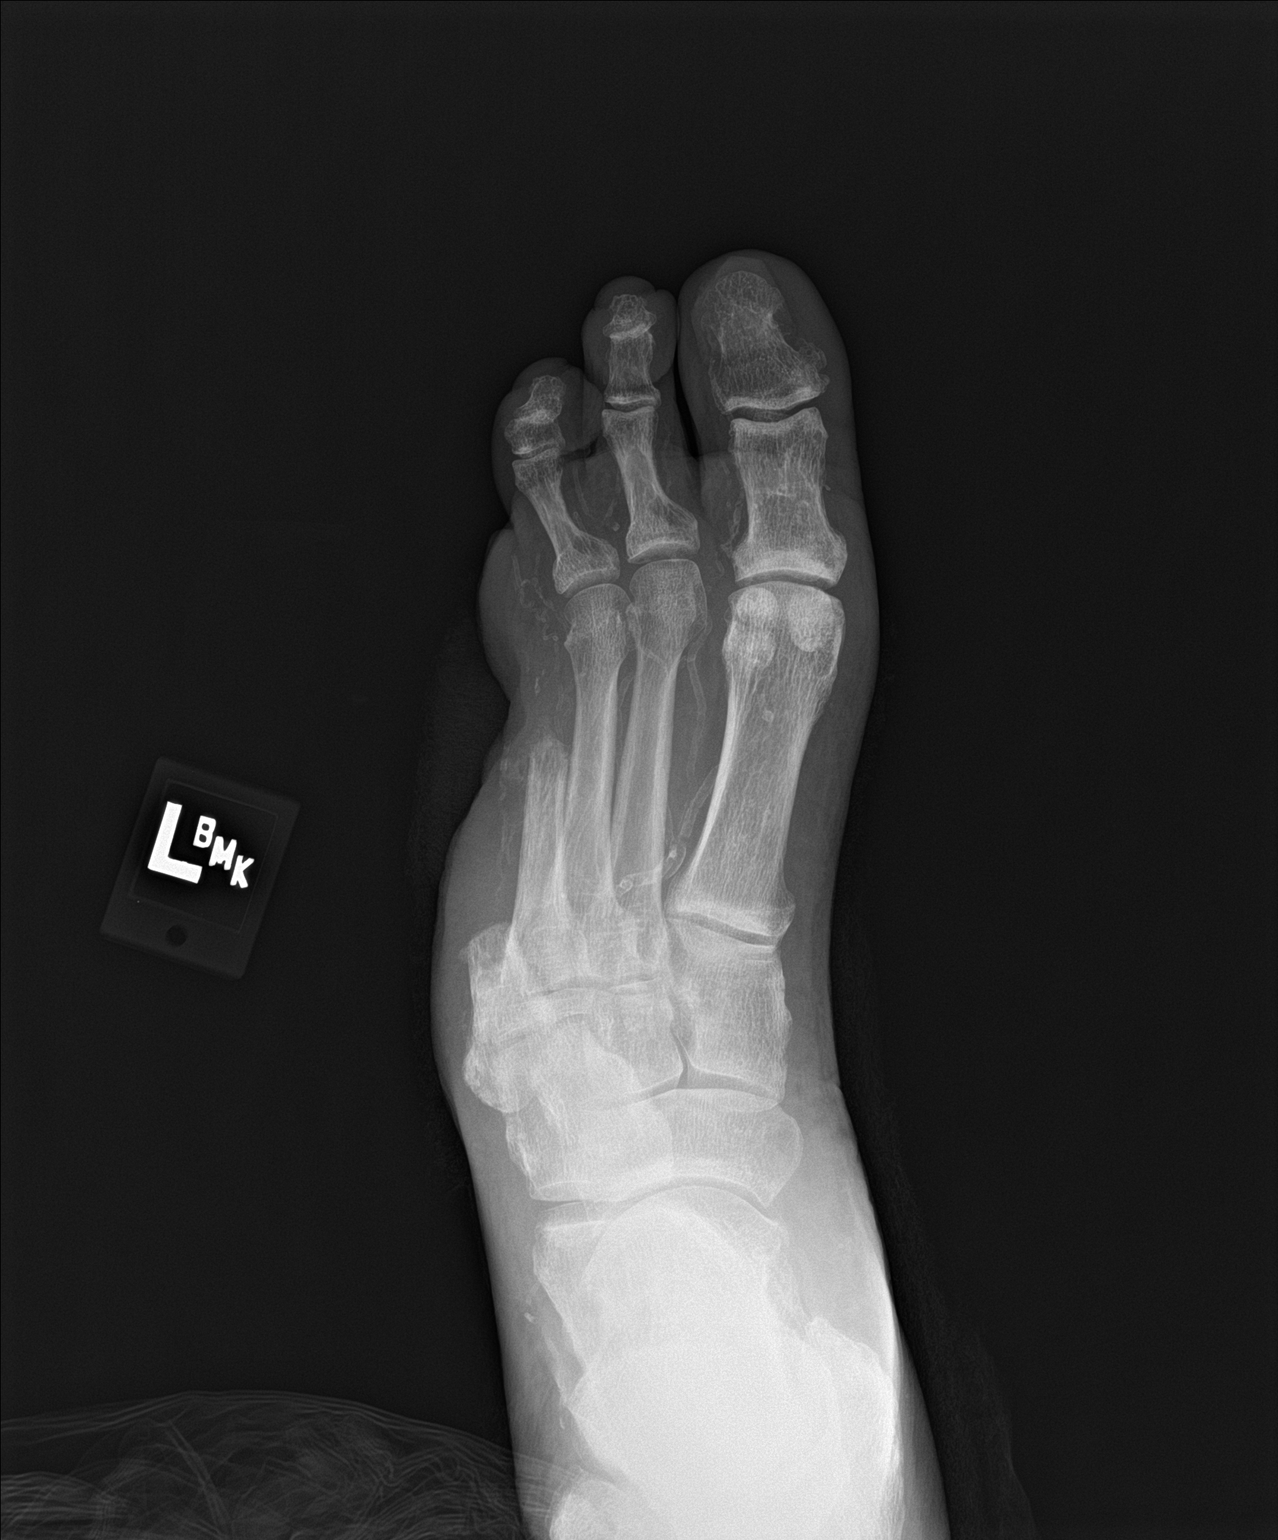

[foot obl]
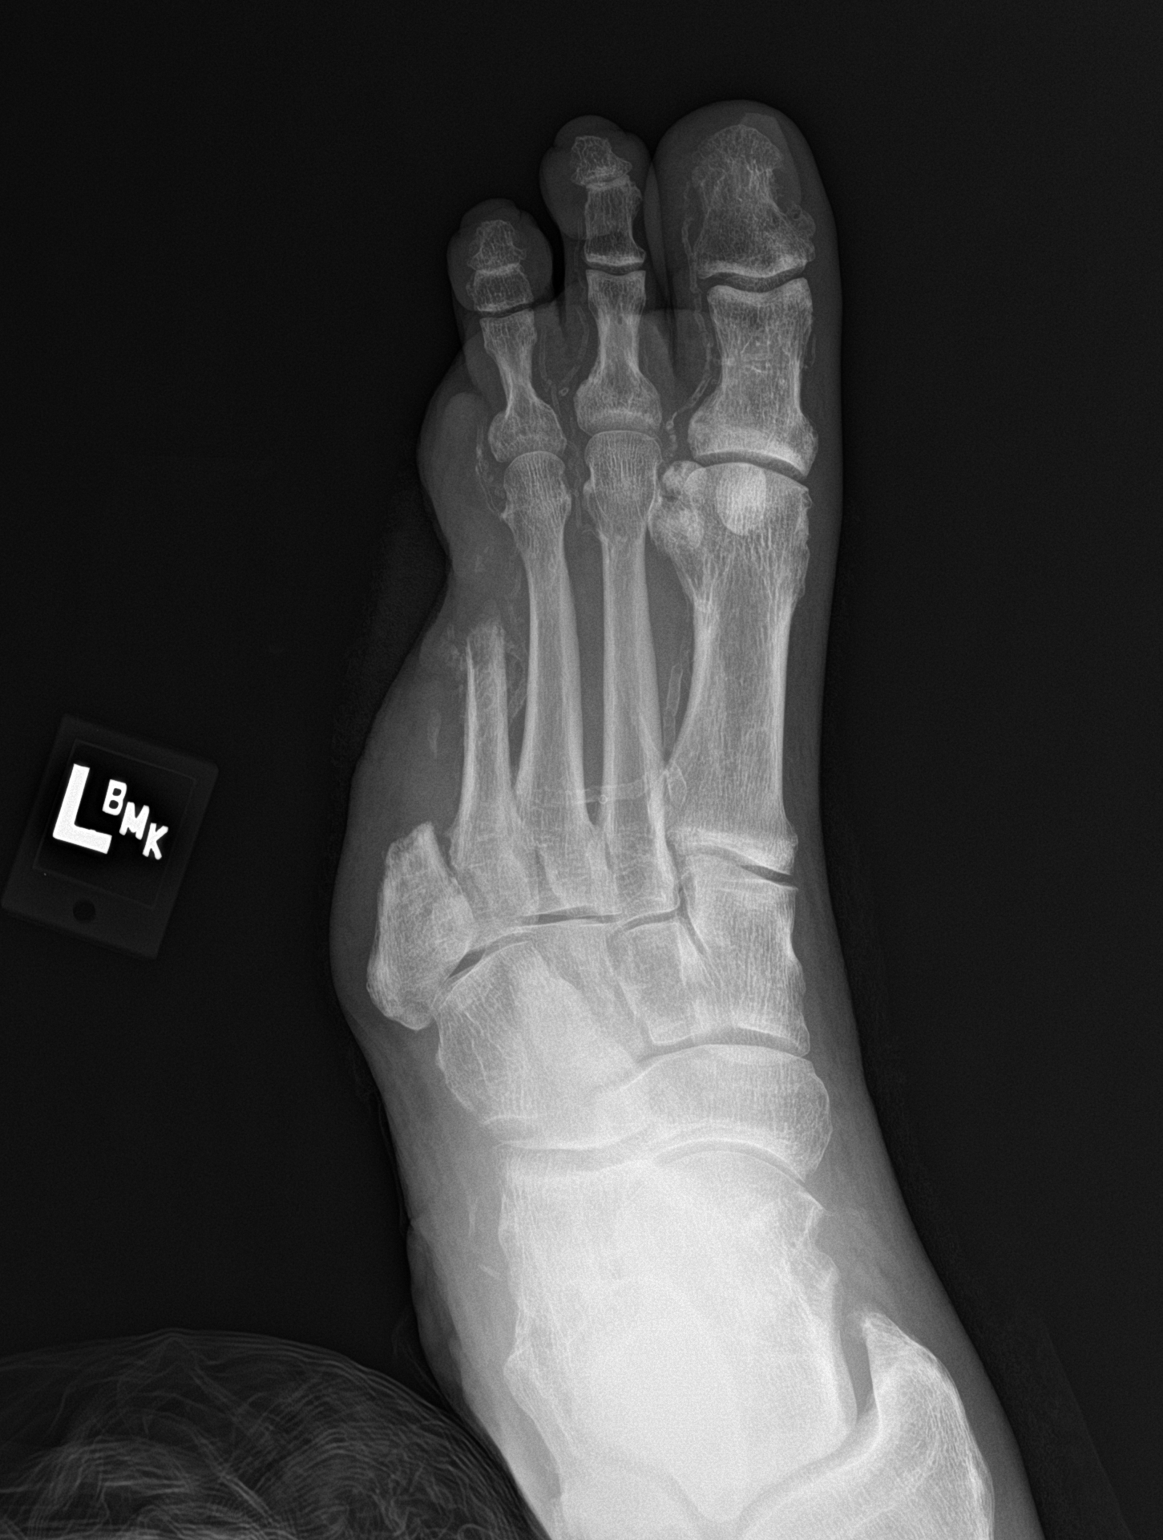

[foot lat]
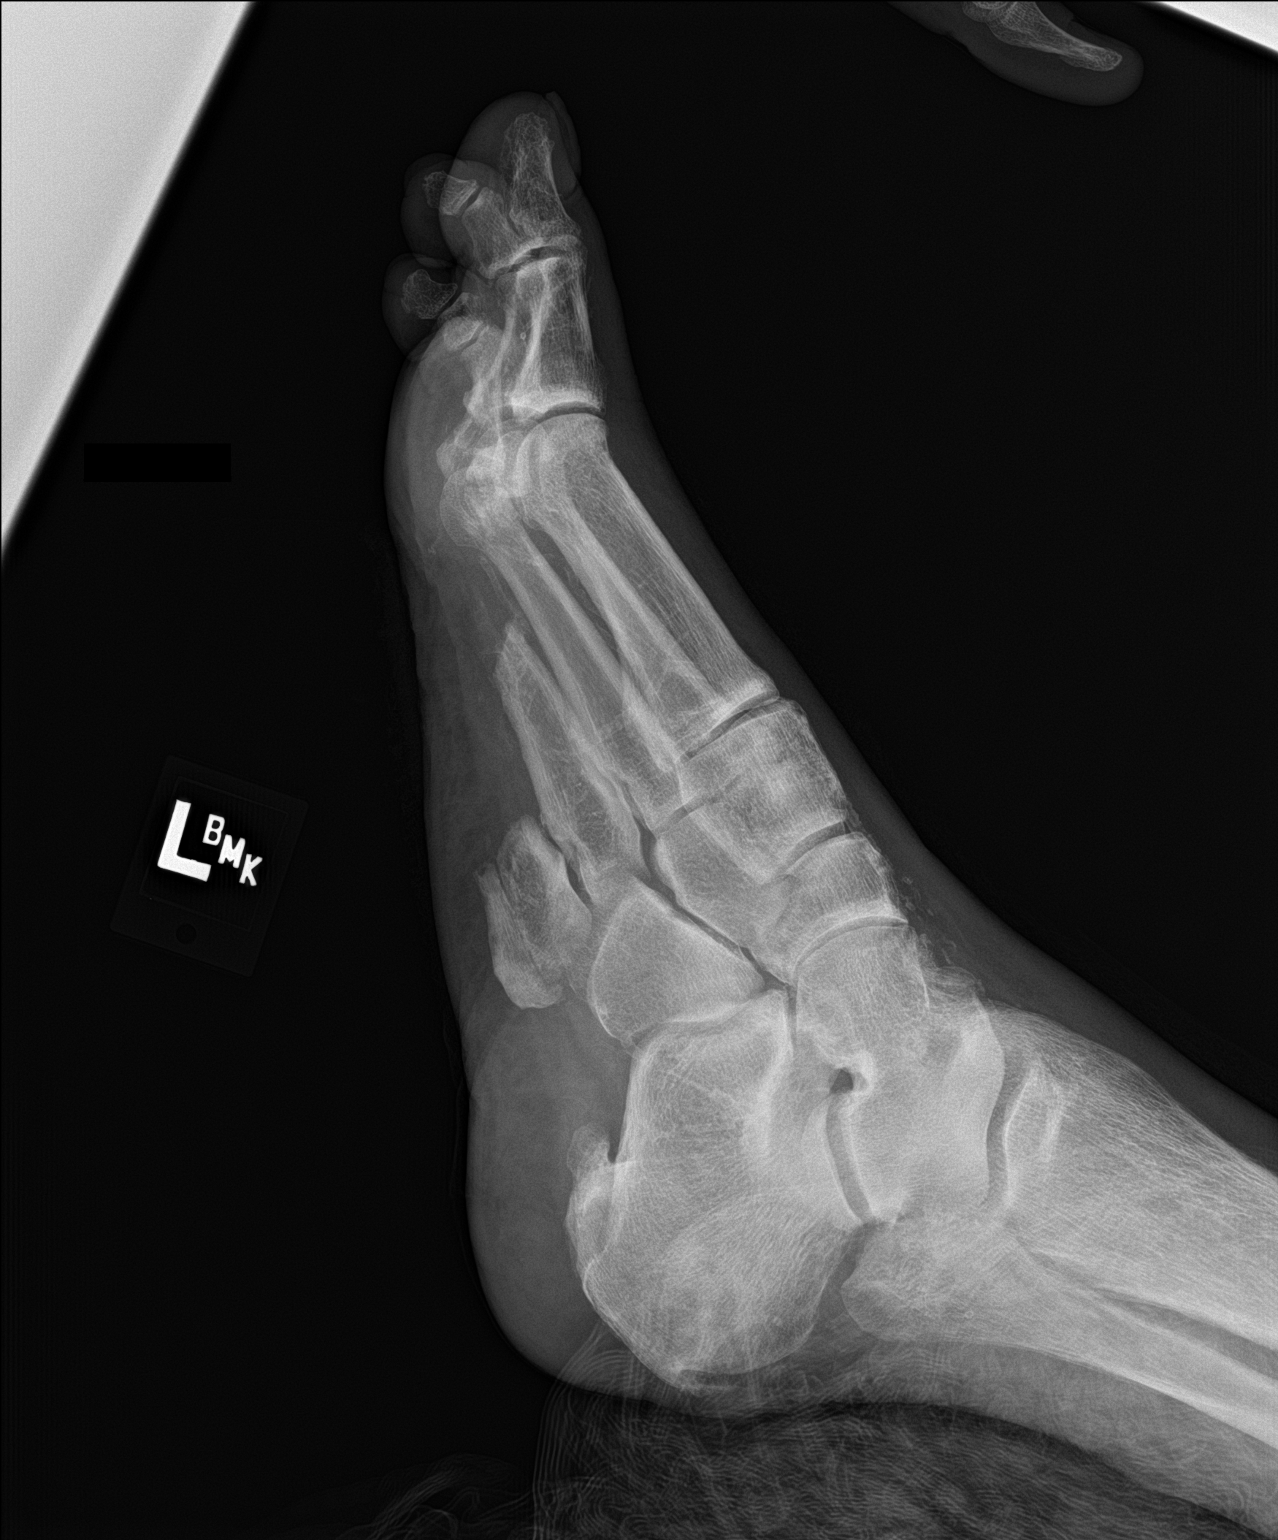

[3 of 3 positions shown; findings below may reference images not displayed]

FINDINGS: Transmetatarsal amputation of the a fourth and fifth digits has been
performed. A a probable deep ulcer is seen within the soft tissues
of the residual left forefoot laterally anterior to the a residual
fourth metatarsal. There is no subjacent osseous erosion to suggest
osteomyelitis. No acute fracture or dislocation. Residual joint
spaces are preserved. Advanced vascular calcifications are seen
within the left foot. Large plantar calcaneal spur.
IMPRESSION: Transmetatarsal amputation of the fourth and fifth digits.
Anterolateral soft tissue ulceration. No superimposed osseous
erosion.

## 2023-03-05 IMAGING — DX DG CHEST 1V PORT
1 series · 1 of 1 positions shown · non-contrast
Comparison: None.

CLINICAL DATA: 69-year-old male with diarrhea, fever, left foot
wound. Diaphoresis.

EXAM:
PORTABLE CHEST 1 VIEW

[chest ap]
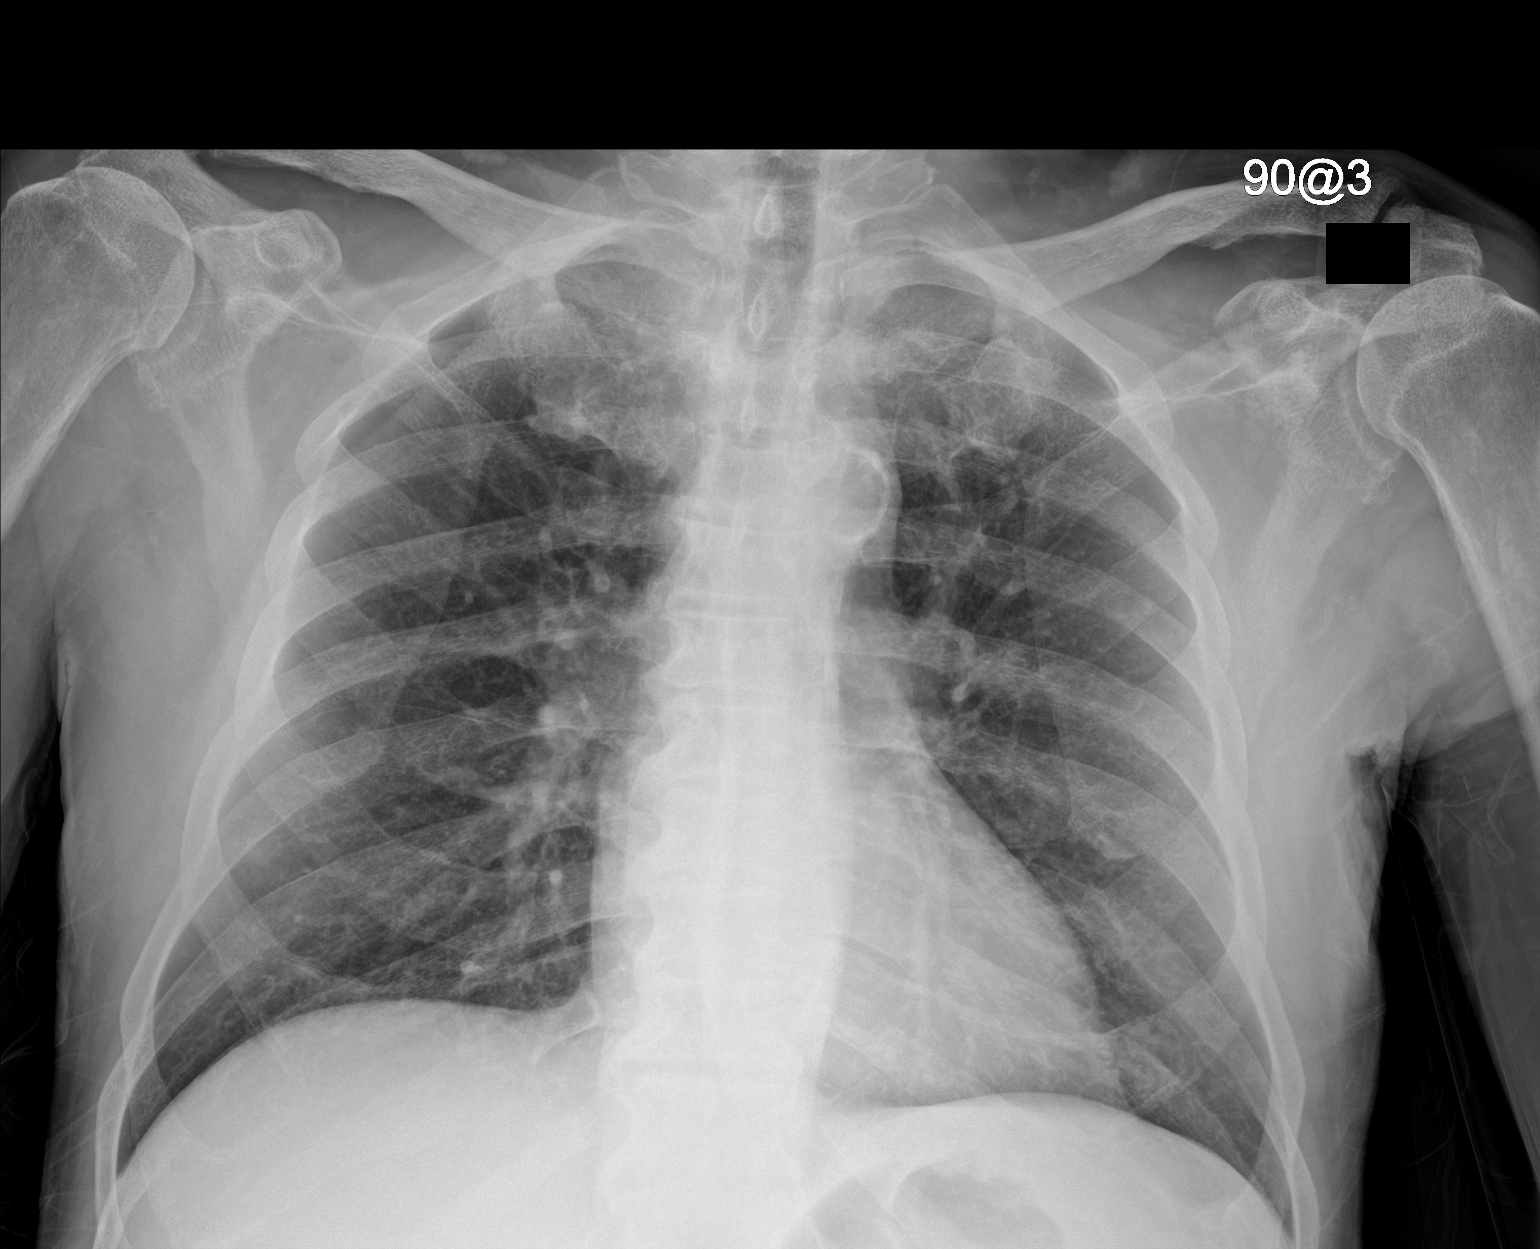

[1 of 1 positions shown; findings below may reference images not displayed]

FINDINGS: Portable AP upright view at 4134 hours. Lung volumes and mediastinal
contours are within normal limits. Visualized tracheal air column is
within normal limits. Calcified aortic atherosclerosis. Allowing for
portable technique the lungs are clear. Somewhat symmetric skin fold
artifacts suspected over both upper lobes. No pneumothorax or
pleural effusion. No acute osseous abnormality identified.
IMPRESSION: 1.  No acute cardiopulmonary abnormality.
2.  Aortic Atherosclerosis ([id number]).

## 2023-03-05 IMAGING — CT CT ABD-PELV W/O CM
2 of 4 series · 15 of 46 positions shown, 17 images · non-contrast
Comparison: None.

CLINICAL DATA: Renal failure.  Diarrhea.  Obstructive uropathy.

EXAM:
CT ABDOMEN AND PELVIS WITHOUT CONTRAST
TECHNIQUE: Multidetector CT imaging of the abdomen and pelvis was performed
following the standard protocol without IV contrast.

[Series 2: routine abd/pel wo · axial · 0.79mm/px · z∈[-1141,-686]mm · 12 of 101 slices shown, 14 images]
[im 5/101  soft-tissue]
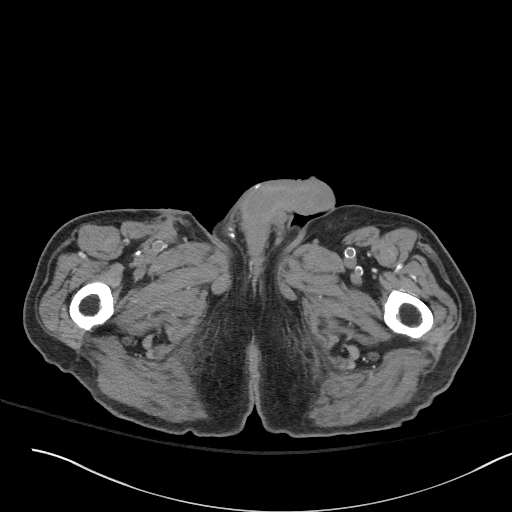
[im 5/101  bone]
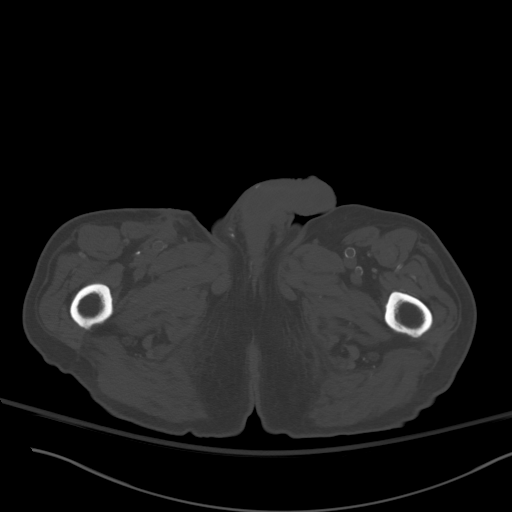
[im 13/101  soft-tissue]
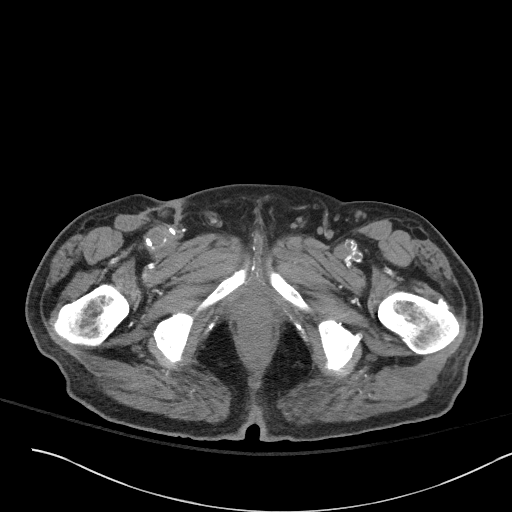
[im 21/101  soft-tissue]
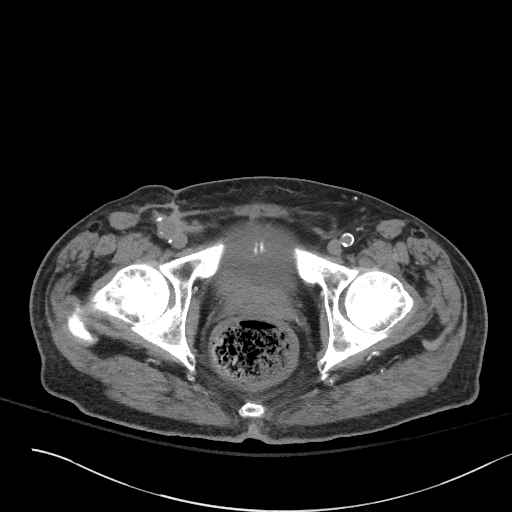
[im 30/101  soft-tissue]
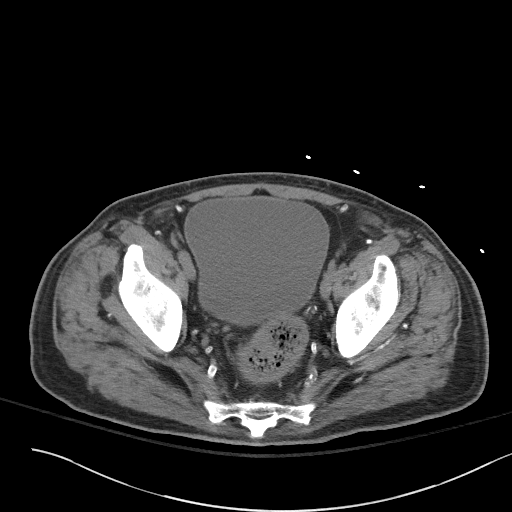
[im 38/101  soft-tissue]
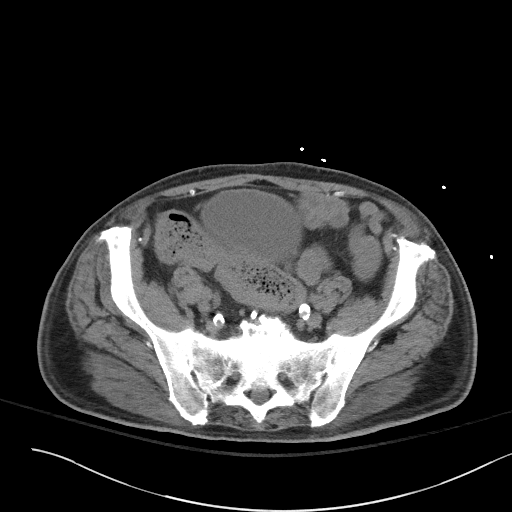
[im 46/101  soft-tissue]
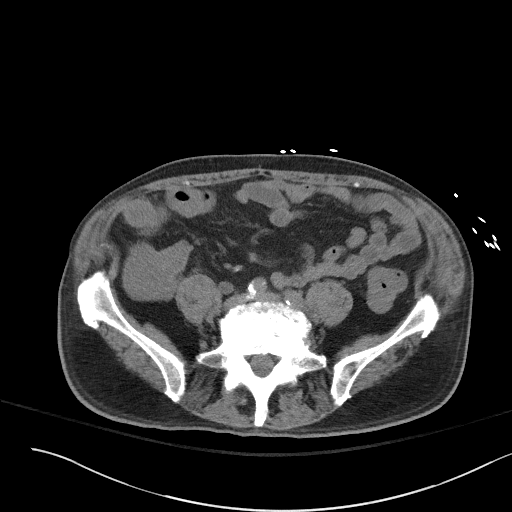
[im 55/101  soft-tissue]
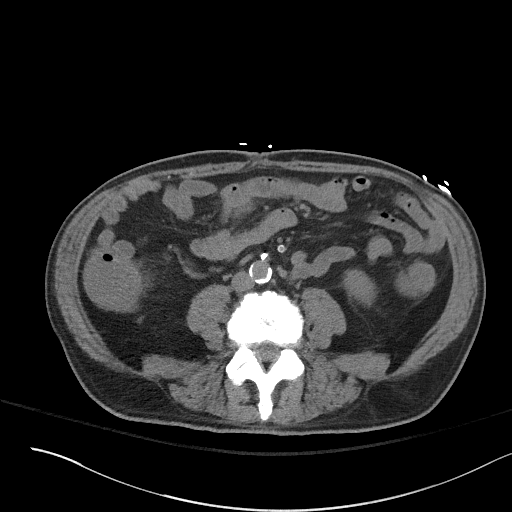
[im 63/101  soft-tissue]
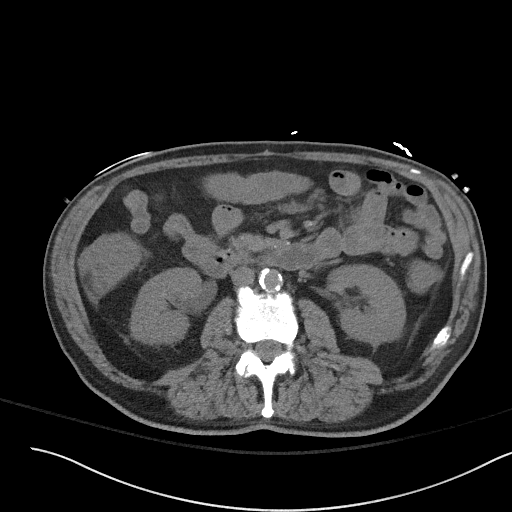
[im 71/101  soft-tissue]
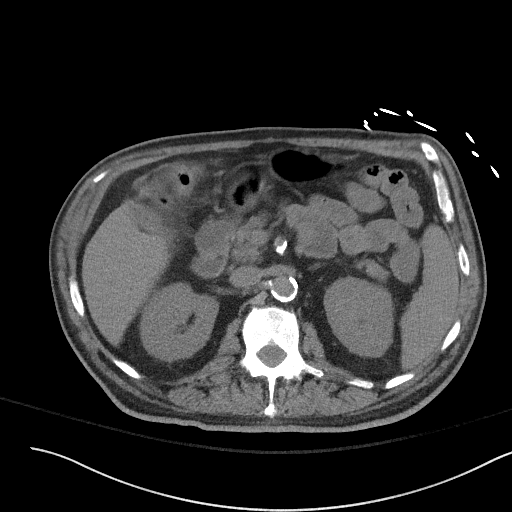
[im 71/101  bone]
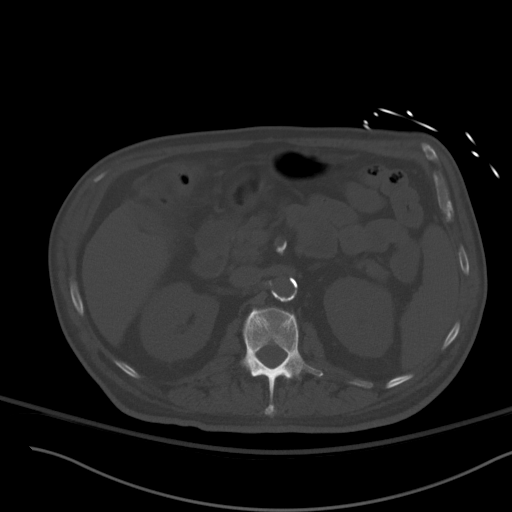
[im 80/101  soft-tissue]
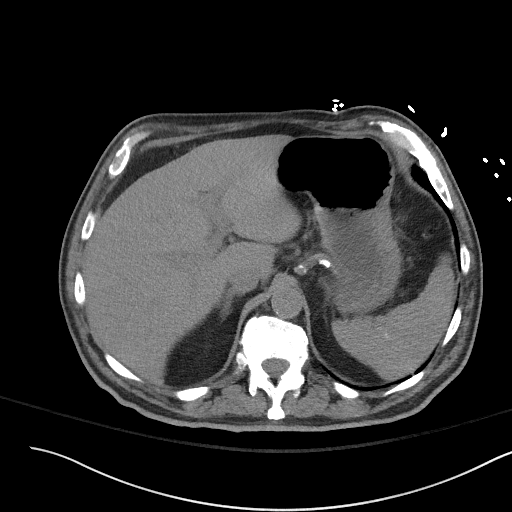
[im 88/101  soft-tissue]
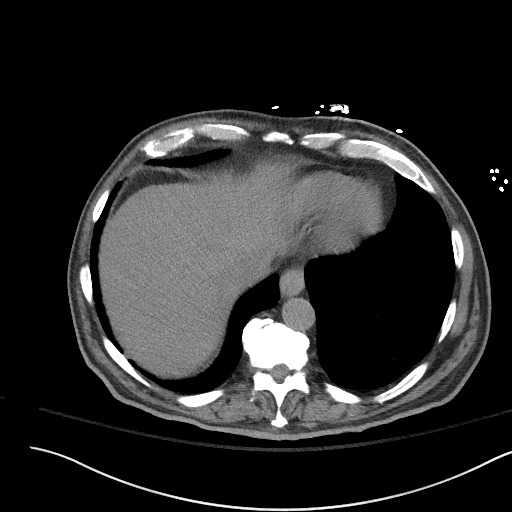
[im 96/101  soft-tissue]
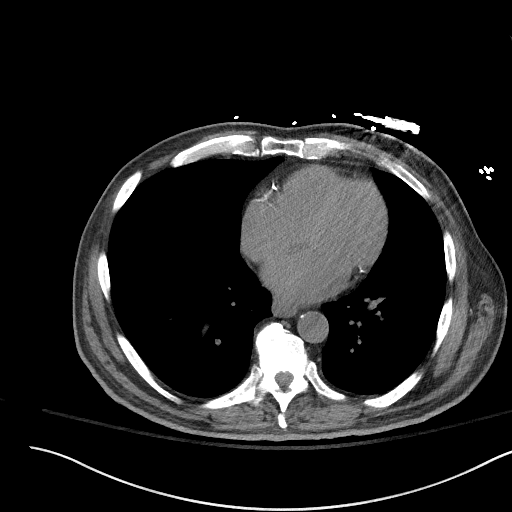

[Series 5: coronal st · coronal · 0.75mm/px · 3 of 94 slices shown]
[im 32/94  soft-tissue]
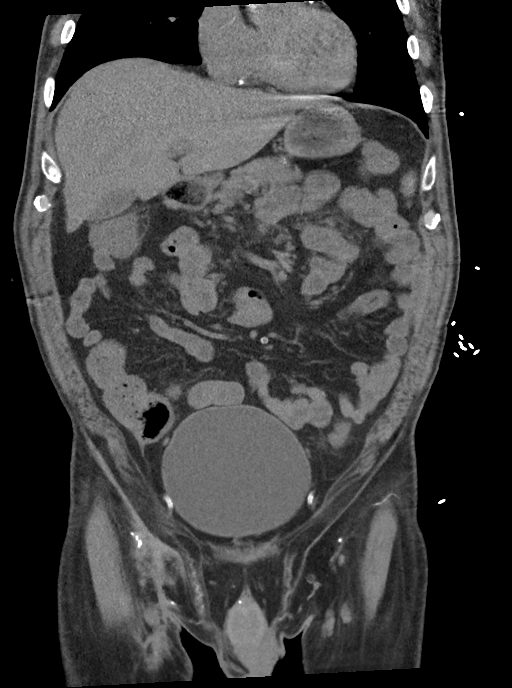
[im 42/94  soft-tissue]
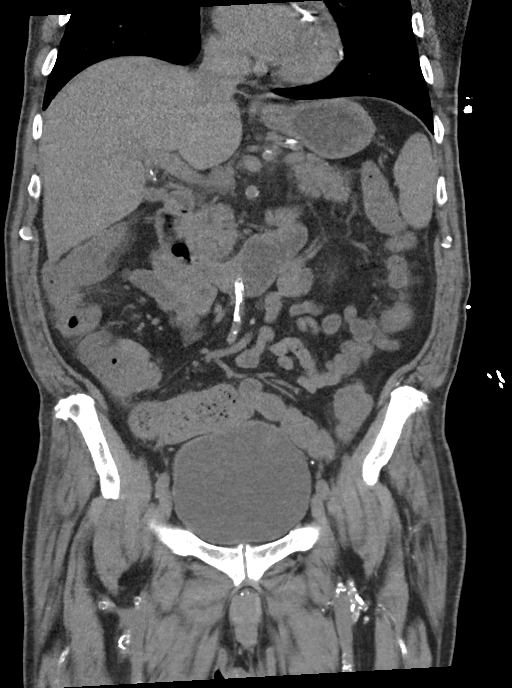
[im 52/94  soft-tissue]
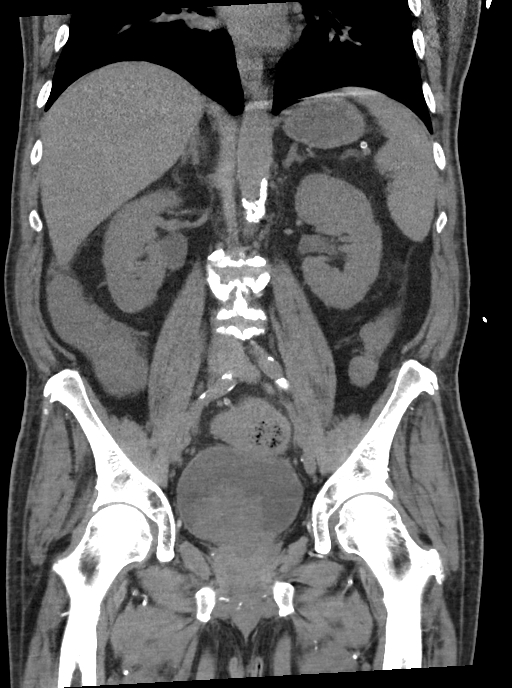

[15 of 46 positions shown; findings below may reference images not displayed]

FINDINGS: Lower chest: No acute abnormality.

Hepatobiliary: No focal liver abnormality is seen. No gallstones,
gallbladder wall thickening, or biliary dilatation.

Pancreas: Unremarkable. No pancreatic ductal dilatation or
surrounding inflammatory changes.

Spleen: Normal in size without focal abnormality.

Adrenals/Urinary Tract: Normal adrenal glands. No kidney mass or
stones identified bilaterally. Right pelviectasis and mild right
hydroureter to the level of the urinary bladder identified.
Posterior dome of bladder wall lesion measures 1.8 cm, image 69/6. A
second lesion is noted along the posterior base measuring 5.9 cm,,
image 58/6. This second lesion appears to obstruct the right UVJ,
image 76/2. Both of these are suspicious for urothelial neoplasms.

Stomach/Bowel: Stomach appears normal. The appendix is not
confidently identified. Within the limitations of unenhanced
technique there is diffuse colonic wall thickening with mild
pericolonic inflammatory soft tissue stranding concerning for
colitis. No signs of pneumatosis or bowel perforation. No evidence
for bowel obstruction. Moderate stool burden identified within the
sigmoid colon and rectum.

Vascular/Lymphatic: Aortic atherosclerosis. No aneurysm. No
abdominopelvic adenopathy.

Reproductive: Prostate is unremarkable.

Other: No abdominal wall hernia or abnormality. No abdominopelvic
ascites.

Musculoskeletal: No acute or significant osseous findings. Marked
degenerative disc disease identified within the lumbar spine. This
is most advanced at L4-5 and L5-S1.
IMPRESSION: 1. Examination is positive for diffuse colonic wall thickening with
mild pericolonic inflammatory soft tissue stranding concerning for
colitis. No signs of pneumatosis or bowel perforation.
2. Right-sided pelviectasis and mild right hydroureter to the level
of the urinary bladder. Two bladder wall lesions are identified
which are suspicious for urothelial neoplasms.
3. Lumbar spondylosis.
4. Aortic atherosclerosis.

Aortic Atherosclerosis ([id number].X).

## 2023-09-05 DEATH — deceased
